# Patient Record
Sex: Female | Born: 1985 | Race: Black or African American | Hispanic: No | Marital: Married | Smoking: Current every day smoker
Health system: Southern US, Community
[De-identification: ages and names within clinical notes are randomized; demographics above are authoritative.]

## PROBLEM LIST (undated history)

## (undated) DIAGNOSIS — F419 Anxiety disorder, unspecified: Secondary | ICD-10-CM

## (undated) DIAGNOSIS — D649 Anemia, unspecified: Secondary | ICD-10-CM

## (undated) DIAGNOSIS — A150 Tuberculosis of lung: Secondary | ICD-10-CM

## (undated) DIAGNOSIS — N390 Urinary tract infection, site not specified: Secondary | ICD-10-CM

## (undated) DIAGNOSIS — D249 Benign neoplasm of unspecified breast: Secondary | ICD-10-CM

## (undated) HISTORY — PX: BREAST LUMPECTOMY: SHX2

---

## 2008-11-10 HISTORY — PX: BREAST LUMPECTOMY: SHX2

## 2012-11-10 HISTORY — PX: BREAST LUMPECTOMY: SHX2

## 2014-03-05 ENCOUNTER — Emergency Department (HOSPITAL_COMMUNITY)
Admission: EM | Admit: 2014-03-05 | Discharge: 2014-03-05 | Disposition: A | Discharge status: Home / Self Care | Payer: Medicaid - Out of State | Attending: Emergency Medicine | Admitting: Emergency Medicine

## 2014-03-05 ENCOUNTER — Encounter (HOSPITAL_COMMUNITY): Payer: Self-pay | Admitting: Emergency Medicine

## 2014-03-05 DIAGNOSIS — Z87440 Personal history of urinary (tract) infections: Secondary | ICD-10-CM | POA: Insufficient documentation

## 2014-03-05 DIAGNOSIS — K219 Gastro-esophageal reflux disease without esophagitis: Secondary | ICD-10-CM | POA: Insufficient documentation

## 2014-03-05 DIAGNOSIS — Z3202 Encounter for pregnancy test, result negative: Secondary | ICD-10-CM | POA: Insufficient documentation

## 2014-03-05 DIAGNOSIS — N12 Tubulo-interstitial nephritis, not specified as acute or chronic: Secondary | ICD-10-CM

## 2014-03-05 LAB — URINE MICROSCOPIC-ADD ON

## 2014-03-05 LAB — BASIC METABOLIC PANEL
BUN: 16 mg/dL (ref 6–23)
CALCIUM: 9 mg/dL (ref 8.4–10.5)
CHLORIDE: 105 meq/L (ref 96–112)
CO2: 25 meq/L (ref 19–32)
CREATININE: 0.79 mg/dL (ref 0.50–1.10)
GFR calc non Af Amer: >90 mL/min (ref >90)
Glucose, Bld: 87 mg/dL (ref 70–99)
Potassium: 3.9 mEq/L (ref 3.7–5.3)
Sodium: 141 mEq/L (ref 137–147)

## 2014-03-05 LAB — URINALYSIS, ROUTINE W REFLEX MICROSCOPIC
Bilirubin Urine: NEGATIVE
Glucose, UA: NEGATIVE mg/dL
Hgb urine dipstick: NEGATIVE
Ketones, ur: NEGATIVE mg/dL
NITRITE: POSITIVE — AB
PH: 6.5 (ref 5.0–8.0)
Protein, ur: NEGATIVE mg/dL
Specific Gravity, Urine: 1.023 (ref 1.005–1.030)
Urobilinogen, UA: 1 mg/dL (ref 0.0–1.0)

## 2014-03-05 LAB — CBC
HEMATOCRIT: 33.5 % — AB (ref 36.0–46.0)
Hemoglobin: 11 g/dL — ABNORMAL LOW (ref 12.0–15.0)
MCH: 30 pg (ref 26.0–34.0)
MCHC: 32.8 g/dL (ref 30.0–36.0)
MCV: 91.3 fL (ref 78.0–100.0)
Platelets: 189 10*3/uL (ref 150–400)
RBC: 3.67 MIL/uL — ABNORMAL LOW (ref 3.87–5.11)
RDW: 12.9 % (ref 11.5–15.5)
WBC: 8.4 10*3/uL (ref 4.0–10.5)

## 2014-03-05 LAB — I-STAT TROPONIN, ED: Troponin i, poc: 0 ng/mL (ref 0.00–0.08)

## 2014-03-05 LAB — POC URINE PREG, ED: Preg Test, Ur: NEGATIVE

## 2014-03-05 MED ORDER — SODIUM CHLORIDE 0.9 % IV BOLUS (SEPSIS): 500.0000 mL | Freq: Once | INTRAVENOUS | Status: DC

## 2014-03-05 MED ORDER — ONDANSETRON HCL 4 MG/2ML IJ SOLN
4.0000 mg | Freq: Once | INTRAMUSCULAR | Status: AC
  Administered 2014-03-05: 4 mg via INTRAVENOUS
  Filled 2014-03-05: qty 2

## 2014-03-05 MED ORDER — KETOROLAC TROMETHAMINE 30 MG/ML IJ SOLN
30.0000 mg | Freq: Once | INTRAMUSCULAR | Status: AC
  Administered 2014-03-05: 30 mg via INTRAVENOUS
  Filled 2014-03-05: qty 1

## 2014-03-05 MED ORDER — DEXTROSE 5 % IV SOLN
1.0000 g | Freq: Once | INTRAVENOUS | Status: AC
  Administered 2014-03-05: 1 g via INTRAVENOUS
  Filled 2014-03-05: qty 10

## 2014-03-05 MED ORDER — SODIUM CHLORIDE 0.9 % IV BOLUS (SEPSIS)
1000.0000 mL | Freq: Once | INTRAVENOUS | Status: AC
  Administered 2014-03-05: 1000 mL via INTRAVENOUS

## 2014-03-05 MED ORDER — GI COCKTAIL ~~LOC~~
30.0000 mL | Freq: Once | ORAL | Status: AC
  Administered 2014-03-05: 30 mL via ORAL
  Filled 2014-03-05: qty 30

## 2014-03-05 MED ORDER — CIPROFLOXACIN HCL 500 MG PO TABS: 500.0000 mg | ORAL_TABLET | Freq: Two times a day (BID) | ORAL | Status: DC

## 2014-03-05 NOTE — ED Notes (Signed)
Md Vanita Panda at bedside.

## 2014-03-05 NOTE — Discharge Instructions (Signed)
Please call your doctor for a followup appointment within 24-48 hours. When you talk to your doctor please let them know that you were seen in the emergency department and have them acquire all of your records so that they can discuss the findings with you and formulate a treatment plan to fully care for your new and ongoing problems. Please call and set-up an appointment with your primary care provider when you get home to [place] Please rest and stay hydrated Please avoid foods high in fat, grease Please continue to take omeprazole as prescribed for GERD Please drink plenty of water - can drink cranberry juice Please take antibiotics and finish course Can use Ibuprofen as needed for discomfort  Please continue to monitor symptoms closely if symptoms are to worsen or change (fever greater than 101, chills, diaphoresis/sweating, chest pain, shortness of breath, difficulty breathing, abdominal pain, nausea, vomiting, diarrhea, inability to keep food and fluids down, dizziness, weakness, blurred vision, sudden loss of vision, back pain) please report back to the ED immediately  Pyelonephritis, Adult Pyelonephritis is a kidney infection. In general, there are 2 main types of pyelonephritis:  Infections that come on quickly without any warning (acute pyelonephritis).  Infections that persist for a long period of time (chronic pyelonephritis). CAUSES  Two main causes of pyelonephritis are:  Bacteria traveling from the bladder to the kidney. This is a problem especially in pregnant women. The urine in the bladder can become filled with bacteria from multiple causes, including:  Inflammation of the prostate gland (prostatitis).  Sexual intercourse in females.  Bladder infection (cystitis).  Bacteria traveling from the bloodstream to the tissue part of the kidney. Problems that may increase your risk of getting a kidney infection include:  Diabetes.  Kidney stones or bladder  stones.  Cancer.  Catheters placed in the bladder.  Other abnormalities of the kidney or ureter. SYMPTOMS   Abdominal pain.  Pain in the side or flank area.  Fever.  Chills.  Upset stomach.  Blood in the urine (dark urine).  Frequent urination.  Strong or persistent urge to urinate.  Burning or stinging when urinating. DIAGNOSIS  Your caregiver may diagnose your kidney infection based on your symptoms. A urine sample may also be taken. TREATMENT  In general, treatment depends on how severe the infection is.   If the infection is mild and caught early, your caregiver may treat you with oral antibiotics and send you home.  If the infection is more severe, the bacteria may have gotten into the bloodstream. This will require intravenous (IV) antibiotics and a hospital stay. Symptoms may include:  High fever.  Severe flank pain.  Shaking chills.  Even after a hospital stay, your caregiver may require you to be on oral antibiotics for a period of time.  Other treatments may be required depending upon the cause of the infection. HOME CARE INSTRUCTIONS   Take your antibiotics as directed. Finish them even if you start to feel better.  Make an appointment to have your urine checked to make sure the infection is gone.  Drink enough fluids to keep your urine clear or pale yellow.  Take medicines for the bladder if you have urgency and frequency of urination as directed by your caregiver. SEEK IMMEDIATE MEDICAL CARE IF:   You have a fever or persistent symptoms for more than 2-3 days.  You have a fever and your symptoms suddenly get worse.  You are unable to take your antibiotics or fluids.  You  develop shaking chills.  You experience extreme weakness or fainting.  There is no improvement after 2 days of treatment. MAKE SURE YOU:  Understand these instructions.  Will watch your condition.  Will get help right away if you are not doing well or get  worse. Document Released: 10/27/2005 Document Revised: 04/27/2012 Document Reviewed: 04/02/2011 [facility] Patient Information 2014 [place], [place].   Diet for Gastroesophageal Reflux Disease, Adult Reflux (acid reflux) is when acid from your stomach flows up into the esophagus. When acid comes in contact with the esophagus, the acid causes irritation and soreness (inflammation) in the esophagus. When reflux happens often or so severely that it causes damage to the esophagus, it is called gastroesophageal reflux disease (GERD). Nutrition therapy can help ease the discomfort of GERD. FOODS OR DRINKS TO AVOID OR LIMIT  Smoking or chewing tobacco. Nicotine is one of the most potent stimulants to acid production in the gastrointestinal tract.  Caffeinated and decaffeinated coffee and black tea.  Regular or low-calorie carbonated beverages or energy drinks (caffeine-free carbonated beverages are allowed).   Strong spices, such as black pepper, white pepper, red pepper, cayenne, curry powder, and chili powder.  Peppermint or spearmint.  Chocolate.  High-fat foods, including meats and fried foods. Extra added fats including oils, butter, salad dressings, and nuts. Limit these to less than 8 tsp per day.  Fruits and vegetables if they are not tolerated, such as citrus fruits or tomatoes.  Alcohol.  Any food that seems to aggravate your condition. If you have questions regarding your diet, call your caregiver or a registered dietitian. OTHER THINGS THAT MAY HELP GERD INCLUDE:   Eating your meals slowly, in a relaxed setting.  Eating 5 to 6 small meals per day instead of 3 large meals.  Eliminating food for a period of time if it causes distress.  Not lying down until 3 hours after eating a meal.  Keeping the head of your bed raised 6 to 9 inches (15 to 23 cm) by using a foam wedge or blocks under the legs of the bed. Lying flat may make symptoms worse.  Being physically active. Weight  loss may be helpful in reducing reflux in overweight or obese adults.  Wear loose fitting clothing EXAMPLE MEAL PLAN This meal plan is approximately 2,000 calories based on [email or web address] meal planning guidelines. Breakfast   cup cooked oatmeal.  1 cup strawberries.  1 cup low-fat milk.  1 oz almonds. Snack  1 cup cucumber slices.  6 oz yogurt (made from low-fat or fat-free milk). Lunch  2 slice whole-wheat bread.  2 oz sliced [place].  2 tsp mayonnaise.  1 cup blueberries.  1 cup snap peas. Snack  6 whole-wheat crackers.  1 oz string cheese. Dinner   cup brown rice.  1 cup mixed veggies.  1 tsp olive oil.  3 oz grilled fish. Document Released: 10/27/2005 Document Revised: 01/19/2012 Document Reviewed: 09/12/2011 [facility] Patient Information 2014 [place], [place].   Emergency Department Resource Guide 1) Find a Doctor and Pay Out of Pocket Although you won't have to find out who is covered by your insurance plan, it is a good idea to ask around and get recommendations. You will then need to call the office and see if the doctor you have chosen will accept you as a new patient and what types of options they offer for patients who are self-pay. Some doctors offer discounts or will set up payment plans for their patients who do not have insurance, but  you will need to ask so you aren't surprised when you get to your appointment.  2) Contact Your Local Health Department Not all health departments have doctors that can see patients for sick visits, but many do, so it is worth a call to see if yours does. If you don't know where your local health department is, you can check in your phone book. The CDC also has a tool to help you locate your state's health department, and many state websites also have listings of all of their local health departments.  3) Find a [facility] If your illness is not likely to be very severe or complicated, you may want to try a  walk in clinic. These are popping up all over the country in pharmacies, drugstores, and shopping centers. They're usually staffed by nurse practitioners or physician assistants that have been trained to treat common illnesses and complaints. They're usually fairly quick and inexpensive. However, if you have serious medical issues or chronic medical problems, these are probably not your best option.  No Primary Care Doctor: - Call Health Connect at  [phone] - they can help you locate a primary care doctor that  accepts your insurance, provides certain services, etc. - Physician Referral Service- [phone]  Chronic Pain Problems: Organization         Address  Phone   Notes  [facility]  [phone] Patients need to be referred by their primary care doctor.   Medication Assistance: Organization         Address  Phone   Notes  [facility] Medication [facility] [address] [place], [place] [address] [phone] --Must be a resident of [facility] -- Must have NO insurance coverage whatsoever (no Medicaid/ Medicare, etc.) -- The pt. MUST have a primary care doctor that directs their care regularly and follows them in the community   MedAssist  [phone]   [employer]  [phone]    Agencies that provide inexpensive medical care: Organization         Address  Phone   Notes  [place]  [phone]   [facility]    [phone]   [facility] [place], [place] [address] [phone]   [place] [address] [place] [phone]   Planned Parenthood    [phone]   [facility]    [phone]   [place] and [place], [place] Phone:  [phone], Fax:  [phone] Hours of Operation:  9 am - 6 pm, M-F.  Also accepts Medicaid/Medicare and self-pay.  [facility] for [place] [address], [address], [place] Phone: [phone], Fax: [phone]. Hours of Operation:  8:30 am - 5:30 pm, M-F.  Also accepts Medicaid and self-pay.  [facility] [place] [address] [place] Phone: [phone]   [place], [place], [place] [phone], Ext. [phone] Mondays & Thursdays: 7-9 AM.  First 15 patients are seen on a first come, first serve basis.    [place] Providers:  Organization         Address  Phone   Notes  [facility] [address], [place] [phone] Also accepts self-pay patients.  [facility] [address], [place]  [place]  [phone]   [place], [address], [place] [phone]   [place] [address], [place] [phone]   Lucianne Lei [address], [place]   [phone] Only accepts [place] Access [place] patients after they have their name applied to their card.   Self-Pay (no insurance) in [facility]:  Organization         Address  Phone   Notes  Sickle Cell Patients, [facility] Internal [facility] [place] [phone]   [facility] [place] [phone]   [facility] [place]  [place], [place], [place] [phone]   [facility]/Dr. Osei-Bonsu  [address], [place] or [address], [place] [phone] Phone number for both [place] and [place] locations is the same.  [facility] [address] [place] [phone]   [facility] [address] [place] or [address] [phone]   [facility] [address] [place] [phone], phone; [phone], fax Sees  patients 1st and 3rd Saturday of every month.  Must not qualify for public or private insurance (i.e. Medicaid, Medicare, [place] Health Choice, Veterans' Benefits)  Household income should be no more than 200% of the poverty level The clinic cannot treat you if you are pregnant or think you are pregnant  Sexually transmitted diseases are not treated at the clinic.    Dental Care: Organization         Address  Phone  Notes  [facility] Department of [facility] [place] [phone] Accepts children up to age 46 who are enrolled in [place] or [place]; pregnant women with a Medicaid card; and children who have applied for Medicaid or [place] Health Choice, but were declined, whose parents can pay a reduced fee at time of service.  [facility] Department of [facility]  [address], [place] [phone] Accepts children up to age 29 who are enrolled in [place] or [place]; pregnant women with a Medicaid card; and children who have applied for Medicaid or [place] Health Choice, but were declined, whose parents can pay a reduced fee at time of service.  [place] Adult Dental Access PROGRAM  [place] [phone] Patients are seen by appointment only. Walk-ins are not accepted. [place] will see patients 45 years of age and older. Monday - Tuesday (8am-5pm) Most Wednesdays (8:30-5pm) $30 per visit, cash only  [facility] Adult Dental Access PROGRAM  [address], [facility] [phone] Patients are seen by appointment only. Walk-ins are not accepted. [place] will see patients 79 years of age and older. One Wednesday Evening (Monthly: Volunteer Based).  $30 per visit, cash only  [place]  [phone] for adults; Children under age 8, call Graduate Pediatric Dentistry at [phone]. Children aged 85-14, please call [phone] to request a  pediatric application.  Dental services are provided in all areas of dental care including fillings, crowns and bridges, complete and partial dentures, implants, gum treatment, root canals, and extractions. Preventive care is also provided. Treatment is provided to both adults and children. Patients are selected via a lottery  and there is often a waiting list.   [facility] [address], [place]  [phone] [email or web address]   [facility] [address] [place], [place] [phone], Ext. [phone] Second and Fourth Thursday of each month, opens at 6:30 AM; Clinic ends at 9 AM.  Patients are seen on a first-come first-served basis, and a limited number are seen during each clinic.   [facility]  [address] Hillard Danker [place], [place] [phone]   Eligibility Requirements You must have lived in [place], [place], or [place] counties for at least the last three months.   You cannot be eligible for state or federal sponsored [employer], including [employer], [place], or [employer].   You generally cannot be eligible for healthcare insurance through your employer.    How to apply: Eligibility screenings are held every Tuesday and Wednesday afternoon from 1:00 pm until 4:00 pm. You do not need an appointment for the interview!  [facility] [address], [place], [place]   [place]  [place] Department  [place]  [phone]    Behavioral Health Resources in the Community: Intensive Outpatient Programs Organization         Address  Phone  Notes  [place]. [address], [place], [place] [phone]   [facility] Outpatient [address] [place], [place]   ADS: [facility] [address], [place], [place]   [place] [address],  [place], [place] or [phone]   Substance Abuse Resources Organization         Address  Phone  Notes  Alcohol and Drug Services  [phone]   [place]  [phone]   [facility] [place]   Chinita Pester  [phone]   Residential & Outpatient Substance Abuse Program  [phone]   Psychological Services Organization         Address  Phone  Notes  [facility] [place]  [place]  [phone]   [place] [address], [place] or [phone]    Mobile Crisis Teams Organization         Address  Phone  Notes  [facility], Mobile Crisis Care Unit  [phone]   [facility]  [address] [place], [place]   [place] [address], [place] [phone]    Self-Help/Support Groups Organization         Address  Phone             Notes  [place]. of [place] - variety of support groups  [place] Call for more information  Narcotics Anonymous (NA), [facility] [address], [employer] [place]  2 meetings at this location   Special educational needs teacher         Address  Phone  Notes  [facility]AP Residential Treatment [place],    [place]  [phone]   [facility]  [address] [place] [phone], [place], [place]   [place], [place] [phone] Admissions: 8am-3pm M-F  Incentives Substance [place] [address]    [place], [place] [phone]   [facility] [place] [address], [place], [place]  [place].,  [place], [place]   [facility] - Intensive Outpatient [address], [place], [place]   [facility] [place].,    [place], [place] [phone] or [phone]   [facility] Services (RTS) [address], [place], [place] Accepts Medicaid  Fellowship [place] [address],  [place] [phone] Substance Abuse/Addiction Treatment   [facility] Organization         Address  Phone  Notes  [facility]  [phone]   Domenic Schwab, PhD [address] Arlis Port[place], [place]   [phone] or [phone]   [place], [place] [phone]   [address], [place], [place] [phone] Insurance/Medicaid/sponsorship through [facility] and Families [address] [place]                                    [place], [place] [phone] [place] [address] [place], [place] [phone]    Dr. Adele Schilder  [phone]   [facility] Dept. 1) [address] [place] 2) [facility] [place] 3)  [place] [address], [place] [phone]  [phone]   [place] [phone] or [phone] (After Hours)

## 2014-03-05 NOTE — ED Notes (Signed)
Pt reports right flank pain x 3 days, progressively worsening. States movement and palpation makes worse. Denies N/V/D. Pt reports recently had UTI but didn't finish course of abx since symptoms resolved. Pt reports urinary frequency, but denies dysuria. NAD.

## 2014-03-05 NOTE — ED Provider Notes (Signed)
CSN: [id number]     Arrival date & time 03/05/14  1203 History   First MD Initiated Contact with Patient 03/05/14 1214     Chief Complaint  Patient presents with  . Flank Pain  . Urinary Frequency     (Consider location/radiation/quality/duration/timing/severity/associated sxs/prior Treatment) The history is provided by the patient. No language interpreter was used.  Laurie Friedman is a 28 y/o female with no known significant past medical history, suspicion for GERD, presenting to the ED with right-sided flank pain has been ongoing for the past 3 days. Reported that the right flank as an aching sensation without radiation. Stated the pain worsens with deep inhalation. Stated that she was diagnosed with a urinary tract infection on 02/10/2014 was started on Macrobid-was due to take 1 tablet 2 times a day for 10 days, patient reported that she took the medication for 5 days and stopped. Reported that she's been having mild nausea without episodes of emesis. Stated that she's been having intermittent chest burning sensation, reported that she was seen and assessed her murmur sounds in ED setting with normal EKGs and normal findings-reported that she was diagnosed with GERD for the past month. Has been taking omeprazole with minimal relief. Stated that she smokes cigarettes, stated that she has stopped approximately one week ago. Denied shortness of breath, difficulty breathing, fever, chills, diaphoresis, diarrhea, melena, hematochezia, hematuria, cough, nasal congestion. Denied birth control. Patient originally from [place] - stated that her and her family drove here, reported taking breaks inbetween.  PCP none  History reviewed. No pertinent past medical history. History reviewed. No pertinent past surgical history. No family history on file. History  Substance Use Topics  . Smoking status: Never Smoker   . Smokeless tobacco: Not on file  . Alcohol Use: No   OB History   Grav Para Term Preterm  Abortions TAB SAB Ect Mult Living                 Review of Systems  Constitutional: Negative for fever and chills.  HENT: Negative for sore throat and trouble swallowing.   Eyes: Negative for visual disturbance.  Respiratory: Positive for chest tightness. Negative for cough and shortness of breath.   Cardiovascular: Negative for chest pain.  Gastrointestinal: Positive for nausea. Negative for vomiting, abdominal pain, diarrhea, constipation, blood in stool and anal bleeding.  Genitourinary: Negative for dysuria, flank pain, decreased urine volume, vaginal bleeding, vaginal discharge and pelvic pain.  Musculoskeletal: Negative for back pain and neck pain.  Neurological: Negative for dizziness, weakness and light-headedness.  All other systems reviewed and are negative.     Allergies  Review of patient's allergies indicates no known allergies.  Home Medications   Prior to Admission medications   Not on File   BP 109/62  Pulse 83  Temp(Src) 97.5 F (36.4 C) (Oral)  Resp 15  Ht 5\' 6"  (1.676 m)  Wt 115 lb (52.164 kg)  BMI 18.57 kg/m2  SpO2 100% Physical Exam  Nursing note and vitals reviewed. Constitutional: She is oriented to person, place, and time. She appears well-developed and well-nourished. No distress.  HENT:  Head: Normocephalic and atraumatic.  Mouth/Throat: Oropharynx is clear and moist. No oropharyngeal exudate.  Eyes: Conjunctivae and EOM are normal. Pupils are equal, round, and reactive to light. Right eye exhibits no discharge. Left eye exhibits no discharge.  Neck: Normal range of motion. Neck supple. No tracheal deviation present.  Cardiovascular: Normal rate, regular rhythm and normal heart sounds.  Exam reveals no friction rub.   No murmur heard. Pulses:      Radial pulses are 2+ on the right side, and 2+ on the left side.       Dorsalis pedis pulses are 2+ on the right side, and 2+ on the left side.  Cap refill less than 3 seconds Negative swelling or  pitting edema identified to the lower extremities Negative calf tenderness upon palpation bilaterally  Pulmonary/Chest: Effort normal and breath sounds normal. No respiratory distress. She has no wheezes. She has no rales. She exhibits no tenderness.  Patient is able to speak in full sentences without difficulty Negative use of accessory muscles Negative stridor  Negative air hunger  Abdominal: Soft. Bowel sounds are normal. She exhibits no distension. There is no tenderness. There is CVA tenderness (right). There is no rebound and no guarding.  Negative abdominal distention noted Soft upon palpation Negative peritoneal signs Negative rigidity upon palpation Positive right-sided CVA tenderness noted  Musculoskeletal: Normal range of motion.  Lymphadenopathy:    She has no cervical adenopathy.  Neurological: She is alert and oriented to person, place, and time. No cranial nerve deficit. She exhibits normal muscle tone. Coordination normal.  Skin: Skin is warm and dry. No rash noted. She is not diaphoretic. No erythema.  Psychiatric: She has a normal mood and affect. Her behavior is normal. Thought content normal.    ED Course  Procedures (including critical care time)  Results for orders placed during the hospital encounter of 03/05/14  URINALYSIS, ROUTINE W REFLEX MICROSCOPIC      Result Value Ref Range   Color, Urine YELLOW  YELLOW   APPearance CLOUDY (*) CLEAR   Specific Gravity, Urine 1.023  1.005 - 1.030   pH 6.5  5.0 - 8.0   Glucose, UA NEGATIVE  NEGATIVE mg/dL   Hgb urine dipstick NEGATIVE  NEGATIVE   Bilirubin Urine NEGATIVE  NEGATIVE   Ketones, ur NEGATIVE  NEGATIVE mg/dL   Protein, ur NEGATIVE  NEGATIVE mg/dL   Urobilinogen, UA 1.0  0.0 - 1.0 mg/dL   Nitrite POSITIVE (*) NEGATIVE   Leukocytes, UA MODERATE (*) NEGATIVE  URINE MICROSCOPIC-ADD ON      Result Value Ref Range   Squamous Epithelial / LPF MANY (*) RARE   WBC, UA 21-50  <3 WBC/hpf   RBC / HPF 0-2  <3  RBC/hpf   Bacteria, UA MANY (*) RARE  CBC      Result Value Ref Range   WBC 8.4  4.0 - 10.5 K/uL   RBC 3.67 (*) 3.87 - 5.11 MIL/uL   Hemoglobin 11.0 (*) 12.0 - 15.0 g/dL   HCT 33.5 (*) 36.0 - 46.0 %   MCV 91.3  78.0 - 100.0 fL   MCH 30.0  26.0 - 34.0 pg   MCHC 32.8  30.0 - 36.0 g/dL   RDW 12.9  11.5 - 15.5 %   Platelets 189  150 - 400 K/uL  BASIC METABOLIC PANEL      Result Value Ref Range   Sodium 141  137 - 147 mEq/L   Potassium 3.9  3.7 - 5.3 mEq/L   Chloride 105  96 - 112 mEq/L   CO2 25  19 - 32 mEq/L   Glucose, Bld 87  70 - 99 mg/dL   BUN 16  6 - 23 mg/dL   Creatinine, Ser 0.79  0.50 - 1.10 mg/dL   Calcium 9.0  8.4 - 10.5 mg/dL   GFR calc non Af  Amer >90  >90 mL/min   GFR calc Af Amer >90  >90 mL/min  POC URINE PREG, ED      Result Value Ref Range   Preg Test, Ur NEGATIVE  NEGATIVE  I-STAT TROPOININ, ED      Result Value Ref Range   Troponin i, poc 0.00  0.00 - 0.08 ng/mL   Comment 3             Labs Review Labs Reviewed  URINALYSIS, ROUTINE W REFLEX MICROSCOPIC - Abnormal; Notable for the following:    APPearance CLOUDY (*)    Nitrite POSITIVE (*)    Leukocytes, UA MODERATE (*)    All other components within normal limits  URINE MICROSCOPIC-ADD ON - Abnormal; Notable for the following:    Squamous Epithelial / LPF MANY (*)    Bacteria, UA MANY (*)    All other components within normal limits  CBC - Abnormal; Notable for the following:    RBC 3.67 (*)    Hemoglobin 11.0 (*)    HCT 33.5 (*)    All other components within normal limits  URINE CULTURE  BASIC METABOLIC PANEL  POC URINE PREG, ED  I-STAT TROPOININ, ED    Imaging Review No results found.   EKG Interpretation   Date/Time:  Sunday March 05 2014 13:31:38 EDT Ventricular Rate:  66 PR Interval:  157 QRS Duration: 91 QT Interval:  376 QTC Calculation: 394 R Axis:   76 Text Interpretation:  Sinus rhythm RSR' in V1 or V2, right VCD or RVH  Sinus rhythm RSR' or QR pattern in V1 suggests  right ventricular  conduction delay Borderline ECG Confirmed by Carmin Muskrat  MD [phone])  on 03/05/2014 3:55:04 PM      MDM   Final diagnoses:  Pyelonephritis  GERD (gastroesophageal reflux disease)   Medications  ondansetron (ZOFRAN) injection 4 mg (4 mg Intravenous Given 03/05/14 1413)  cefTRIAXone (ROCEPHIN) 1 g in dextrose 5 % 50 mL IVPB (0 g Intravenous Stopped 03/05/14 1443)  sodium chloride 0.9 % bolus 1,000 mL (0 mLs Intravenous Stopped 03/05/14 1517)  ketorolac (TORADOL) 30 MG/ML injection 30 mg (30 mg Intravenous Given 03/05/14 1427)  gi cocktail (Maalox,Lidocaine,Donnatal) (30 mLs Oral Given 03/05/14 1602)   Filed Vitals:   03/05/14 1330 03/05/14 1415 03/05/14 1445 03/05/14 1515  BP: 100/61 117/59 101/55 109/62  Pulse: 71 77 51 83  Temp:      TempSrc:      Resp:  22 18 15   Height:      Weight:      SpO2: 100% 100% 100% 100%    EKG normal sinus rhythm with a heart rate of 66 beats per minute with mild RVH-negative ischemic findings noted.. I-STAT troponin negative elevation. CBC negative elevated white blood cell count identified. BMP negative findings-kidneys functioning well. Urine pregnancy negative. Urinalysis noted positive nitrites, moderate leukocytes with pyuria of 21-50 many bacteria noted. Urine culture pending. Patient given IV fluids as well as a round of antibiotics in ED setting. Pain controlled in ED setting. Doubt acute abdominal processes. Abdomen soft, negative peritoneal signs-nonsurgical abdomen noted. Doubt nephrolithiasis. Doubt ectopic pregnancy. Doubt TOA. Patient presenting to the ED with pyelonephritis secondary to noncompliance with antibiotic medication with initial UTI. Negative elevated white blood cell count, patient afebrile, negative drop in pulse ox-negative hypoxia noted, negative tachypnea tachycardia-patient does not appear septic. Doubt cardiac issue-patient is 28 years of age with limited risk factors. This chest pain has been ongoing for  a  couple of months as per patient and she reported that she has had numerous EKGs with negative findings. Patient has history of smoking. Denied BCP. Doubt PE - PERC score 0. Suspicion of chest discomfort to be GERD. Patient stable, afebrile. Patient stable for discharge. Discharged patient with antibiotics. Discussed with patient to rest and stay hydrated. Discussed with patient to followup with primary care provider when she gets home to [place]. Discussed with patient importance of medical compliance and finishing antibiotics - discussed dangers, consequences if not finished, discussed possible sepsis if antibiotics not properly taken. Discussed with patient to closely monitor symptoms and if symptoms are to worsen or change to report back to the ED - strict return instructions given.  Patient agreed to plan of care, understood, all questions answered.   Jamse Mead, PA-C 03/06/14 1159

## 2014-03-05 NOTE — ED Notes (Signed)
Pt presents to department for evaluation of R sided flank pain. Also states urinary frequency. 8/10 pain at the time. States she was recently diagnosed with UTI, but did not complete antibiotic regimen. States continued pain. Pt is alert and oriented x4. NAD.

## 2014-03-07 LAB — URINE CULTURE: Colony Count: >=100000

## 2014-03-08 NOTE — ED Provider Notes (Signed)
  Medical screening examination/treatment/procedure(s) were performed by non-physician practitioner and as supervising physician I was immediately available for consultation/collaboration.   EKG Interpretation   Date/Time:  Sunday March 05 2014 13:31:38 EDT Ventricular Rate:  66 PR Interval:  157 QRS Duration: 91 QT Interval:  376 QTC Calculation: 394 R Axis:   76 Text Interpretation:  Sinus rhythm RSR' in V1 or V2, right VCD or RVH  Sinus rhythm RSR' or QR pattern in V1 suggests right ventricular  conduction delay Borderline ECG Confirmed by Carmin Muskrat  MD [phone])  on 03/05/2014 3:55:04 PM            Carmin Muskrat, MD 03/08/14 1525

## 2014-03-10 ENCOUNTER — Telehealth (HOSPITAL_BASED_OUTPATIENT_CLINIC_OR_DEPARTMENT_OTHER): Payer: Self-pay | Admitting: Emergency Medicine

## 2014-03-10 NOTE — Telephone Encounter (Signed)
Post ED Visit - Positive Culture Follow-up  Culture report reviewed by antimicrobial stewardship pharmacist: []  Wes Dulaney, Pharm.D., BCPS []  Heide Guile, Pharm.D., BCPS [x]  Alycia Rossetti, Pharm.D., BCPS []  Coats, [place].D., BCPS, AAHIVP []  Legrand Como, Pharm.D., BCPS, AAHIVP []  Juliene Pina, Pharm.D.  Positive urine culture Treated with Cipro, organism sensitive to the same and no further patient follow-up is required at this time.  Vara Mairena 03/10/2014, 2:06 PM

## 2020-05-26 ENCOUNTER — Emergency Department (HOSPITAL_COMMUNITY)
Admission: EM | Admit: 2020-05-26 | Discharge: 2020-05-26 | Disposition: A | Discharge status: Home / Self Care | Payer: HRSA Program | Attending: Emergency Medicine | Admitting: Emergency Medicine

## 2020-05-26 ENCOUNTER — Other Ambulatory Visit: Payer: Self-pay

## 2020-05-26 ENCOUNTER — Emergency Department (HOSPITAL_COMMUNITY): Payer: HRSA Program

## 2020-05-26 ENCOUNTER — Encounter (HOSPITAL_COMMUNITY): Payer: Self-pay

## 2020-05-26 DIAGNOSIS — J189 Pneumonia, unspecified organism: Secondary | ICD-10-CM | POA: Insufficient documentation

## 2020-05-26 DIAGNOSIS — Z20822 Contact with and (suspected) exposure to covid-19: Secondary | ICD-10-CM | POA: Insufficient documentation

## 2020-05-26 DIAGNOSIS — R0602 Shortness of breath: Secondary | ICD-10-CM | POA: Diagnosis present

## 2020-05-26 LAB — BASIC METABOLIC PANEL
Anion gap: 9 (ref 5–15)
BUN: 10 mg/dL (ref 6–20)
CO2: 22 mmol/L (ref 22–32)
Calcium: 9.4 mg/dL (ref 8.9–10.3)
Chloride: 105 mmol/L (ref 98–111)
Creatinine, Ser: 0.8 mg/dL (ref 0.44–1.00)
GFR calc Af Amer: >60 mL/min (ref >60)
GFR calc non Af Amer: >60 mL/min (ref >60)
Glucose, Bld: 111 mg/dL — ABNORMAL HIGH (ref 70–99)
Potassium: 3.4 mmol/L — ABNORMAL LOW (ref 3.5–5.1)
Sodium: 136 mmol/L (ref 135–145)

## 2020-05-26 LAB — CBC
HCT: 38.8 % (ref 36.0–46.0)
Hemoglobin: 12.1 g/dL (ref 12.0–15.0)
MCH: 27.9 pg (ref 26.0–34.0)
MCHC: 31.2 g/dL (ref 30.0–36.0)
MCV: 89.6 fL (ref 80.0–100.0)
Platelets: 234 10*3/uL (ref 150–400)
RBC: 4.33 MIL/uL (ref 3.87–5.11)
RDW: 13.8 % (ref 11.5–15.5)
WBC: 6.4 10*3/uL (ref 4.0–10.5)
nRBC: 0 % (ref 0.0–0.2)

## 2020-05-26 LAB — SARS CORONAVIRUS 2 BY RT PCR (HOSPITAL ORDER, PERFORMED IN ~~LOC~~ HOSPITAL LAB): SARS Coronavirus 2: NEGATIVE

## 2020-05-26 LAB — TROPONIN I (HIGH SENSITIVITY): Troponin I (High Sensitivity): <2 ng/L (ref <18)

## 2020-05-26 LAB — I-STAT BETA HCG BLOOD, ED (MC, WL, AP ONLY): I-stat hCG, quantitative: <5 m[IU]/mL (ref <5)

## 2020-05-26 LAB — D-DIMER, QUANTITATIVE: D-Dimer, Quant: 0.7 ug/mL-FEU — ABNORMAL HIGH (ref 0.00–0.50)

## 2020-05-26 MED ORDER — AMOXICILLIN 500 MG PO CAPS
1000.0000 mg | ORAL_CAPSULE | Freq: Three times a day (TID) | ORAL | 0 refills | Status: AC
Start: 2020-05-26 — End: 2020-05-31

## 2020-05-26 MED ORDER — IOHEXOL 350 MG/ML SOLN
75.0000 mL | Freq: Once | INTRAVENOUS | Status: AC | PRN
  Administered 2020-05-26: 75 mL via INTRAVENOUS

## 2020-05-26 MED ORDER — AZITHROMYCIN 250 MG PO TABS
250.0000 mg | ORAL_TABLET | Freq: Every day | ORAL | 0 refills | Status: DC
Start: 2020-05-26 — End: 2022-04-22

## 2020-05-26 MED ORDER — SODIUM CHLORIDE 0.9% FLUSH: 3.0000 mL | Freq: Once | INTRAVENOUS | Status: DC

## 2020-05-26 NOTE — Discharge Instructions (Signed)
Your CT scan and lab work were largely reassuring but it did show a developing pneumonia in the right lower lobe of your lung.  Please take both antibiotics as directed, even if you are feeling better it is important that you take entire course of antibiotics.  If you develop worsening shortness of breath, chest pain, fevers or any other new or concerning symptoms please return to the ED.  I have also sent off a Covid test to ensure this is not the cause of your pneumonia, you will be called by phone if these results are positive.  Please consider getting your Covid vaccine

## 2020-05-26 NOTE — ED Triage Notes (Signed)
Pt arrives to ED w/ c/o sob x 2 days. Pt denies cp, cough, fever. Pt has hx of anxiety but states this feels different. Resp e/u.

## 2020-05-26 NOTE — ED Notes (Signed)
Verbalized understanding of DC instructions, Rx, follow up care.  Informed patient to self quarantine until COVID test has resulted

## 2020-05-26 NOTE — ED Provider Notes (Signed)
[facility] EMERGENCY DEPARTMENT Provider Note   CSN: [id number] Arrival date & time: 05/26/20  1357     History Chief Complaint  Patient presents with  . Shortness of Breath    Laurie Friedman is a 34 y.o. female.  Laurie Friedman is a 34 y.o. female with a history of anxiety, otherwise healthy, who presents to the emergency department for evaluation of shortness of breath.  Patient states that for the past 4 days she found herself frequently feeling like she is out of breath or cannot catch her breath.  She states the symptoms happen with light activity, and sometimes when she is sitting still.  She denies any associated chest pain.  No fevers but patient does report that she has had an occasional cough and yesterday started coughing up some phlegm.  No lower extremity swelling or pain.  No lightheadedness or syncope.  She denies any pleuritic pain.  She states that this feels different than the symptoms she usually gets with her anxiety.  She does report that she is recently been moving into a new house after traveling here from [place] and has certainly had a lot on her plate, but is worried that these are the symptoms she typically has with her anxiety.  She denies any prior history of blood clots, is not on any birth control.  Recent travel down from [place], no recent surgeries.  No other aggravating or alleviating factors.  No history of heart disease.        History reviewed. No pertinent past medical history.  There are no problems to display for this patient.   History reviewed. No pertinent surgical history.   OB History   No obstetric history on file.     No family history on file.  Social History   Tobacco Use  . Smoking status: Never Smoker  Substance Use Topics  . Alcohol use: No  . Drug use: No    Home Medications Prior to Admission medications   Medication Sig Start Date End Date Taking? Authorizing Provider  amoxicillin (AMOXIL)  500 MG capsule Take 2 capsules (1,000 mg total) by mouth 3 (three) times daily for 5 days. 05/26/20 05/31/20  Jacqlyn Larsen, PA-C  azithromycin (ZITHROMAX) 250 MG tablet Take 1 tablet (250 mg total) by mouth daily. Take first 2 tablets together, then 1 every day until finished. 05/26/20   Jacqlyn Larsen, PA-C    Allergies    Patient has no known allergies.  Review of Systems   Review of Systems  Constitutional: Negative for chills and fever.  HENT: Negative.   Respiratory: Positive for shortness of breath. Negative for cough, chest tightness and wheezing.   Cardiovascular: Negative for chest pain, palpitations and leg swelling.  Gastrointestinal: Negative for abdominal pain, nausea and vomiting.  Genitourinary: Negative for dysuria and frequency.  Musculoskeletal: Negative for arthralgias and myalgias.  Skin: Negative for color change and rash.  Neurological: Negative for dizziness, syncope and light-headedness.  All other systems reviewed and are negative.   Physical Exam Updated Vital Signs BP 122/76 (BP Location: Right Arm)   Pulse (!) 102   Temp 98.5 F (36.9 C) (Oral)   Resp (!) 22   Ht 5\' 6"  (1.676 m)   Wt 54.4 kg   BMI 19.37 kg/m   Physical Exam Vitals and nursing note reviewed.  Constitutional:      General: She is not in acute distress.    Appearance: She is  well-developed and normal weight. She is not ill-appearing or diaphoretic.  HENT:     Head: Normocephalic and atraumatic.  Eyes:     General:        Right eye: No discharge.        Left eye: No discharge.     Pupils: Pupils are equal, round, and reactive to light.  Cardiovascular:     Rate and Rhythm: Normal rate and regular rhythm.     Pulses:          Radial pulses are 2+ on the right side and 2+ on the left side.       Dorsalis pedis pulses are 2+ on the right side and 2+ on the left side.     Heart sounds: Normal heart sounds. No murmur heard.  No friction rub. No gallop.   Pulmonary:     Effort:  Pulmonary effort is normal. No respiratory distress.     Breath sounds: Normal breath sounds. No wheezing or rales.     Comments: Respirations equal and unlabored, patient able to speak in full sentences, lungs clear to auscultation bilaterally Chest:     Chest wall: No tenderness.  Abdominal:     General: Bowel sounds are normal. There is no distension.     Palpations: Abdomen is soft. There is no mass.     Tenderness: There is no abdominal tenderness. There is no guarding.     Comments: Abdomen soft, nondistended, nontender to palpation in all quadrants without guarding or peritoneal signs  Musculoskeletal:        General: No deformity or signs of injury.     Cervical back: Neck supple.     Right lower leg: No tenderness. No edema.     Left lower leg: No tenderness. No edema.  Skin:    General: Skin is warm and dry.     Capillary Refill: Capillary refill takes less than 2 seconds.  Neurological:     Mental Status: She is alert.     Coordination: Coordination normal.     Comments: Speech is clear, able to follow commands Moves extremities without ataxia, coordination intact  Psychiatric:        Mood and Affect: Mood normal.        Behavior: Behavior normal.     ED Results / Procedures / Treatments   Labs (all labs ordered are listed, but only abnormal results are displayed) Labs Reviewed  BASIC METABOLIC PANEL - Abnormal; Notable for the following components:      Result Value   Potassium 3.4 (*)    Glucose, Bld 111 (*)    All other components within normal limits  D-DIMER, QUANTITATIVE (NOT AT [facility]) - Abnormal; Notable for the following components:   D-Dimer, Quant 0.70 (*)    All other components within normal limits  SARS CORONAVIRUS 2 BY RT PCR (HOSPITAL ORDER, [facility])  CBC  I-STAT BETA HCG BLOOD, ED ([facility], [facility], [facility] ONLY)  TROPONIN I (HIGH SENSITIVITY)    EKG None  Radiology DG Chest 2 View  Result Date: 05/26/2020 CLINICAL DATA:   Shortness of breath EXAM: CHEST - 2 VIEW COMPARISON:  None. FINDINGS: The heart size and mediastinal contours are within normal limits. Both lungs are clear. The visualized skeletal structures are unremarkable. IMPRESSION: No active cardiopulmonary disease. Electronically Signed   By: Prudencio Pair M.D.   On: 05/26/2020 15:03   CT Angio Chest PE W and/or Wo Contrast  Result Date:  05/26/2020 CLINICAL DATA:  Chest pain cough and fever EXAM: CT ANGIOGRAPHY CHEST WITH CONTRAST TECHNIQUE: Multidetector CT imaging of the chest was performed using the standard protocol during bolus administration of intravenous contrast. Multiplanar CT image reconstructions and MIPs were obtained to evaluate the vascular anatomy. CONTRAST:  3mL OMNIPAQUE IOHEXOL 350 MG/ML SOLN COMPARISON:  None. FINDINGS: Cardiovascular: There is a optimal opacification of the pulmonary arteries. There is no central,segmental, or subsegmental filling defects within the pulmonary arteries. The heart is normal in size. No pericardial effusion or thickening. No evidence right heart strain. There is normal three-vessel brachiocephalic anatomy without proximal stenosis. The thoracic aorta is normal in appearance. Mediastinum/Nodes: No hilar, mediastinal, or axillary adenopathy. Thyroid gland, trachea, and esophagus demonstrate no significant findings. Lungs/Pleura: Minimal tree-in-bud opacities are seen the right middle lobe and right lung base. No large airspace consolidation or pleural effusion. Mild biapical scarring is seen. No pleural effusion or pneumothorax. No airspace consolidation. Upper Abdomen: No acute abnormalities present in the visualized portions of the upper abdomen. Musculoskeletal: No chest wall abnormality. No acute or significant osseous findings. Review of the MIP images confirms the above findings. IMPRESSION: No central, segmental, or subsegmental pulmonary embolism. Nonspecific minimal tree-in-bud opacities at the right lung base  which may be due to infectious or inflammatory. Electronically Signed   By: Prudencio Pair M.D.   On: 05/26/2020 17:50    Procedures Procedures (including critical care time)  Medications Ordered in ED Medications  sodium chloride flush (NS) 0.9 % injection 3 mL (has no administration in time range)  iohexol (OMNIPAQUE) 350 MG/ML injection 75 mL (75 mLs Intravenous Contrast Given 05/26/20 1726)    ED Course  I have reviewed the triage vital signs and the nursing notes.  Pertinent labs & imaging results that were available during my care of the patient were reviewed by me and considered in my medical decision making (see chart for details).    MDM Rules/Calculators/A&P                          Patient presents to the emergency department with SOB, and describes it as feeling like she has not been able to catch her breath over the past few days. Patient nontoxic appearing, in no apparent distress, lungs are clear and she is not tachypneic or in any respiratory distress.  On arrival she was mildly tachycardic at 102 and mildly tachypneic but on my evaluation tachycardia has resolved.. Fairly benign physical exam.   DDX: pulmonary embolism, dissection, pneumothorax, pneumonia, ACS, arrhythmia, severe anemia, MSK, GERD, anxiety. Evaluation initiated with labs, EKG, and CXR. Patient on cardiac monitor.   CBC: No leukocytosis, normal hemoglobin BMP: Mild hypokalemia of 3.4, glucose 111, no other electrolyte derangements, normal renal function Troponin: Negative EKG: Sinus tachycardia, no ischemic changes  CXR:  Negative, without infiltrate, effusion, pneumothorax, or fracture/dislocation.  D-dimer:0.70, will proceed with CTA  CTA: With no evidence of PE, but does show some tree-in-bud changes that could be consistent with infection, patient did have some cough with phlegm, so this may be developing pneumonia.  EKG shows sinus tachycardia with no ischemic changes.  Aside from trip few months  ago from [place] patient does not have risk factors for PE, but was mildly tachy on arrival so is not PERC neg, D-dimer elevated, CTA Shows no evidence of PE but some signs of possible right lower lobe pneumonia.  Will treat with antibiotics for CAP.  Will send  off Covid swab as well as patient is not vaccinated.  Cardiac monitor reviewed, no notable arrhythmias or tachycardia. Patient has appeared hemodynamically stable throughout ER visit and appears safe for discharge with close PCP follow up. I discussed results, treatment plan, need for PCP follow-up, and return precautions with the patient. Provided opportunity for questions, patient confirmed understanding and is in agreement with plan.   Final Clinical Impression(s) / ED Diagnoses Final diagnoses:  SOB (shortness of breath)  Community acquired pneumonia of right lower lobe of lung    Rx / DC Orders ED Discharge Orders         Ordered    amoxicillin (AMOXIL) 500 MG capsule  3 times daily     Discontinue  Reprint     05/26/20 1842    azithromycin (ZITHROMAX) 250 MG tablet  Daily     Discontinue  Reprint     05/26/20 1842           Jacqlyn Larsen, PA-C 05/26/20 1844    Pattricia Boss, MD 05/27/20 0004

## 2020-06-21 ENCOUNTER — Emergency Department (HOSPITAL_COMMUNITY)
Admission: EM | Admit: 2020-06-21 | Discharge: 2020-06-22 | Disposition: A | Discharge status: Home / Self Care | Payer: Medicaid Other | Attending: Emergency Medicine | Admitting: Emergency Medicine

## 2020-06-21 ENCOUNTER — Other Ambulatory Visit: Payer: Self-pay

## 2020-06-21 ENCOUNTER — Encounter (HOSPITAL_COMMUNITY): Payer: Self-pay

## 2020-06-21 ENCOUNTER — Emergency Department (HOSPITAL_COMMUNITY): Payer: Medicaid Other

## 2020-06-21 DIAGNOSIS — R0602 Shortness of breath: Secondary | ICD-10-CM | POA: Diagnosis present

## 2020-06-21 DIAGNOSIS — Z5321 Procedure and treatment not carried out due to patient leaving prior to being seen by health care provider: Secondary | ICD-10-CM | POA: Insufficient documentation

## 2020-06-21 LAB — I-STAT BETA HCG BLOOD, ED (MC, WL, AP ONLY): I-stat hCG, quantitative: <5 m[IU]/mL (ref <5)

## 2020-06-21 LAB — CBC
HCT: 38.3 % (ref 36.0–46.0)
Hemoglobin: 12.2 g/dL (ref 12.0–15.0)
MCH: 28.6 pg (ref 26.0–34.0)
MCHC: 31.9 g/dL (ref 30.0–36.0)
MCV: 89.9 fL (ref 80.0–100.0)
Platelets: 221 10*3/uL (ref 150–400)
RBC: 4.26 MIL/uL (ref 3.87–5.11)
RDW: 14.1 % (ref 11.5–15.5)
WBC: 6.7 10*3/uL (ref 4.0–10.5)
nRBC: 0 % (ref 0.0–0.2)

## 2020-06-21 LAB — BASIC METABOLIC PANEL
Anion gap: 10 (ref 5–15)
BUN: 13 mg/dL (ref 6–20)
CO2: 21 mmol/L — ABNORMAL LOW (ref 22–32)
Calcium: 9.4 mg/dL (ref 8.9–10.3)
Chloride: 107 mmol/L (ref 98–111)
Creatinine, Ser: 0.93 mg/dL (ref 0.44–1.00)
GFR calc Af Amer: >60 mL/min (ref >60)
GFR calc non Af Amer: >60 mL/min (ref >60)
Glucose, Bld: 140 mg/dL — ABNORMAL HIGH (ref 70–99)
Potassium: 3.6 mmol/L (ref 3.5–5.1)
Sodium: 138 mmol/L (ref 135–145)

## 2020-06-21 LAB — TROPONIN I (HIGH SENSITIVITY): Troponin I (High Sensitivity): <2 ng/L (ref <18)

## 2020-06-21 NOTE — ED Triage Notes (Signed)
Pt arrives to ED w/ c/o sob since Monday. Pt seen here recently for the same. Pt denies chest pain, fever. Resp e/u.

## 2020-06-22 NOTE — ED Notes (Addendum)
Pt name called 3 times no response

## 2020-06-25 ENCOUNTER — Emergency Department (HOSPITAL_COMMUNITY)
Admission: EM | Admit: 2020-06-25 | Discharge: 2020-06-25 | Disposition: A | Discharge status: Home / Self Care | Payer: Medicaid Other | Attending: Emergency Medicine | Admitting: Emergency Medicine

## 2020-06-25 ENCOUNTER — Encounter (HOSPITAL_COMMUNITY): Payer: Self-pay | Admitting: Emergency Medicine

## 2020-06-25 ENCOUNTER — Emergency Department (HOSPITAL_COMMUNITY): Payer: Medicaid Other

## 2020-06-25 ENCOUNTER — Other Ambulatory Visit: Payer: Self-pay

## 2020-06-25 DIAGNOSIS — R0602 Shortness of breath: Secondary | ICD-10-CM | POA: Insufficient documentation

## 2020-06-25 DIAGNOSIS — Z5321 Procedure and treatment not carried out due to patient leaving prior to being seen by health care provider: Secondary | ICD-10-CM | POA: Diagnosis not present

## 2020-06-25 DIAGNOSIS — R05 Cough: Secondary | ICD-10-CM | POA: Diagnosis not present

## 2020-06-25 DIAGNOSIS — R0981 Nasal congestion: Secondary | ICD-10-CM | POA: Diagnosis not present

## 2020-06-25 HISTORY — DX: Tuberculosis of lung: A15.0

## 2020-06-25 NOTE — ED Triage Notes (Signed)
Pt reports 3 weeks ago had PNA took antibiotics for a week. States that felt better for a week then started back having SOB.  Reports that she went to another ED last Thursday but left due to wait times being so long. Has some cough and congestion.

## 2020-07-27 ENCOUNTER — Other Ambulatory Visit: Payer: Self-pay | Admitting: Endocrinology

## 2020-07-27 DIAGNOSIS — N83209 Unspecified ovarian cyst, unspecified side: Secondary | ICD-10-CM

## 2020-08-02 ENCOUNTER — Ambulatory Visit
Admission: RE | Admit: 2020-08-02 | Discharge: 2020-08-02 | Disposition: A | Discharge status: Home / Self Care | Payer: Medicaid Other | Source: Ambulatory Visit | Attending: Endocrinology | Admitting: Endocrinology

## 2020-08-02 DIAGNOSIS — N83209 Unspecified ovarian cyst, unspecified side: Secondary | ICD-10-CM

## 2020-08-08 ENCOUNTER — Encounter: Payer: Self-pay | Admitting: *Deleted

## 2020-08-28 ENCOUNTER — Encounter: Payer: Self-pay | Admitting: Student

## 2020-09-10 ENCOUNTER — Other Ambulatory Visit: Payer: Self-pay

## 2020-09-26 ENCOUNTER — Other Ambulatory Visit: Payer: Self-pay | Admitting: Endocrinology

## 2020-09-26 DIAGNOSIS — N6022 Fibroadenosis of left breast: Secondary | ICD-10-CM

## 2020-09-26 DIAGNOSIS — N6021 Fibroadenosis of right breast: Secondary | ICD-10-CM

## 2020-10-22 ENCOUNTER — Ambulatory Visit
Admission: RE | Admit: 2020-10-22 | Discharge: 2020-10-22 | Disposition: A | Discharge status: Home / Self Care | Payer: Medicaid Other | Source: Ambulatory Visit | Attending: Endocrinology | Admitting: Endocrinology

## 2020-10-22 ENCOUNTER — Other Ambulatory Visit: Payer: Self-pay

## 2020-10-22 DIAGNOSIS — N6022 Fibroadenosis of left breast: Secondary | ICD-10-CM

## 2020-10-22 DIAGNOSIS — N6021 Fibroadenosis of right breast: Secondary | ICD-10-CM

## 2022-01-13 ENCOUNTER — Other Ambulatory Visit: Payer: Self-pay | Admitting: Endocrinology

## 2022-01-13 DIAGNOSIS — D242 Benign neoplasm of left breast: Secondary | ICD-10-CM

## 2022-01-13 DIAGNOSIS — D241 Benign neoplasm of right breast: Secondary | ICD-10-CM

## 2022-02-05 ENCOUNTER — Other Ambulatory Visit: Payer: Medicaid Other

## 2022-02-06 ENCOUNTER — Other Ambulatory Visit: Payer: Medicaid Other

## 2022-02-12 ENCOUNTER — Ambulatory Visit
Admission: RE | Admit: 2022-02-12 | Discharge: 2022-02-12 | Disposition: A | Discharge status: Home / Self Care | Payer: Medicaid Other | Source: Ambulatory Visit | Attending: Endocrinology | Admitting: Endocrinology

## 2022-02-12 DIAGNOSIS — D241 Benign neoplasm of right breast: Secondary | ICD-10-CM

## 2022-03-10 ENCOUNTER — Ambulatory Visit: Payer: Self-pay | Admitting: Surgery

## 2022-04-22 ENCOUNTER — Encounter (HOSPITAL_BASED_OUTPATIENT_CLINIC_OR_DEPARTMENT_OTHER): Payer: Self-pay | Admitting: Surgery

## 2022-04-22 ENCOUNTER — Other Ambulatory Visit: Payer: Self-pay

## 2022-04-25 ENCOUNTER — Encounter (HOSPITAL_COMMUNITY): Payer: Self-pay | Admitting: Emergency Medicine

## 2022-04-25 ENCOUNTER — Ambulatory Visit (HOSPITAL_COMMUNITY)
Admission: EM | Admit: 2022-04-25 | Discharge: 2022-04-25 | Disposition: A | Discharge status: Home / Self Care | Payer: Medicaid Other | Attending: Physician Assistant | Admitting: Physician Assistant

## 2022-04-25 DIAGNOSIS — R051 Acute cough: Secondary | ICD-10-CM | POA: Diagnosis not present

## 2022-04-25 DIAGNOSIS — R0981 Nasal congestion: Secondary | ICD-10-CM | POA: Diagnosis not present

## 2022-04-25 DIAGNOSIS — F17210 Nicotine dependence, cigarettes, uncomplicated: Secondary | ICD-10-CM | POA: Diagnosis not present

## 2022-04-25 DIAGNOSIS — J069 Acute upper respiratory infection, unspecified: Secondary | ICD-10-CM | POA: Diagnosis present

## 2022-04-25 DIAGNOSIS — Z20822 Contact with and (suspected) exposure to covid-19: Secondary | ICD-10-CM | POA: Diagnosis not present

## 2022-04-25 MED ORDER — PROMETHAZINE-DM 6.25-15 MG/5ML PO SYRP: 5.0000 mL | ORAL_SOLUTION | Freq: Four times a day (QID) | ORAL | 0 refills | Status: DC | PRN

## 2022-04-25 MED ORDER — FLUTICASONE PROPIONATE 50 MCG/ACT NA SUSP: 1.0000 | Freq: Every day | NASAL | 0 refills | Status: DC

## 2022-04-25 NOTE — ED Provider Notes (Signed)
[place]    CSN: [id number] Arrival date & time: 04/25/22  1705      History   Chief Complaint Chief Complaint  Patient presents with   Sore Throat   Cough   Nasal Congestion    HPI CHALSEY LEETH is a 36 y.o. female.   Patient presents today with a 3-day history of URI symptoms.  Reports nasal congestion, cough, sore throat, body aches.  Denies any chest pain, shortness of breath, nausea, vomiting.  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  She has had COVID with last episode in October 2022.  She has not had COVID-19 vaccine.  She has not tried any over-the-counter medication for symptom management.  She is confident that she is not pregnant.  She was sent home from work and is requesting COVID-19 testing today.  She denies history of allergies, asthma, COPD.  She does smoke.  Denies any recent antibiotic or steroids.    Past Medical History:  Diagnosis Date   Anxiety    Fibroadenoma of breast     There are no problems to display for this patient.   Past Surgical History:  Procedure Laterality Date   BREAST LUMPECTOMY     x2   CESAREAN SECTION      OB History   No obstetric history on file.      Home Medications    Prior to Admission medications   Medication Sig Start Date End Date Taking? Authorizing Provider  fluticasone (FLONASE) 50 MCG/ACT nasal spray Place 1 spray into both nostrils daily. 04/25/22  Yes Neetu Carrozza K, PA-C  promethazine-dextromethorphan (PROMETHAZINE-DM) 6.25-15 MG/5ML syrup Take 5 mLs by mouth 4 (four) times daily as needed for cough. 04/25/22  Yes Gionna Polak, Derry Skill, PA-C    Family History Family History  Problem Relation Age of Onset   Breast cancer Neg Hx     Social History Social History   Tobacco Use   Smoking status: Every Day    Packs/day: 1.00    Types: Cigarettes   Smokeless tobacco: Never  Vaping Use   Vaping Use: Never used  Substance Use Topics   Alcohol use:  No   Drug use: No     Allergies   Patient has no known allergies.   Review of Systems Review of Systems  Constitutional:  Positive for activity change and fatigue. Negative for appetite change and fever.  HENT:  Positive for congestion and sore throat. Negative for sinus pressure and sneezing.   Respiratory:  Positive for cough. Negative for shortness of breath.   Cardiovascular:  Negative for chest pain.  Gastrointestinal:  Negative for abdominal pain, diarrhea, nausea and vomiting.  Neurological:  Positive for headaches. Negative for dizziness and light-headedness.     Physical Exam Triage Vital Signs ED Triage Vitals  Enc Vitals Group     BP 04/25/22 1723 95/64     Pulse Rate 04/25/22 1723 88     Resp 04/25/22 1723 16     Temp 04/25/22 1723 98.4 F (36.9 C)     Temp src --      SpO2 04/25/22 1723 98 %     Weight --      Height --      Head Circumference --      Peak Flow --      Pain Score 04/25/22 1721 3     Pain Loc --      Pain Edu? --  Excl. in GC? --    No data found.  Updated Vital Signs BP 95/64 (BP Location: Left Arm)   Pulse 88   Temp 98.4 F (36.9 C)   Resp 16   LMP 04/06/2022 (Exact Date)   SpO2 98%   Visual Acuity Right Eye Distance:   Left Eye Distance:   Bilateral Distance:    Right Eye Near:   Left Eye Near:    Bilateral Near:     Physical Exam Vitals reviewed.  Constitutional:      General: She is awake. She is not in acute distress.    Appearance: Normal appearance. She is well-developed. She is not ill-appearing.     Comments: Very pleasant female appears stated age in no acute distress sitting comfortably in exam room  HENT:     Head: Normocephalic and atraumatic.     Right Ear: Tympanic membrane, ear canal and external ear normal. Tympanic membrane is not erythematous or bulging.     Left Ear: Tympanic membrane, ear canal and external ear normal. Tympanic membrane is not erythematous or bulging.     Nose:     Right  Sinus: No maxillary sinus tenderness or frontal sinus tenderness.     Left Sinus: No maxillary sinus tenderness or frontal sinus tenderness.     Mouth/Throat:     Pharynx: Uvula midline. No oropharyngeal exudate or posterior oropharyngeal erythema.  Cardiovascular:     Rate and Rhythm: Normal rate and regular rhythm.     Heart sounds: Normal heart sounds, S1 normal and S2 normal. No murmur heard. Pulmonary:     Effort: Pulmonary effort is normal.     Breath sounds: Normal breath sounds. No wheezing, rhonchi or rales.     Comments: Clear to auscultation bilaterally Psychiatric:        Behavior: Behavior is cooperative.      UC Treatments / Results  Labs (all labs ordered are listed, but only abnormal results are displayed) Labs Reviewed  SARS CORONAVIRUS 2 (TAT 6-24 HRS)    EKG   Radiology No results found.  Procedures Procedures (including critical care time)  Medications Ordered in UC Medications - No data to display  Initial Impression / Assessment and Plan / UC Course  I have reviewed the triage vital signs and the nursing notes.  Pertinent labs & imaging results that were available during my care of the patient were reviewed by me and considered in my medical decision making (see chart for details).     Patient is well-appearing, afebrile, nontoxic, nontachycardic.  No indication for flu testing as patient been symptomatic for more than 72 hours and this would not change management.  COVID testing was obtained-results pending.  She was encouraged to monitor MyChart for these results.  No evidence of acute infection on physical exam that would warrant initiation of antibiotics.  Recommended conservative treatment measures.  She was given Flonase and Promethazine DM for symptom management.  Discussed that she should not drive or drink alcohol while taking Promethazine DM due to associated drowsiness.  She is to rest and drink plenty of fluids.  She was provided a work  excuse note with current CDC return to work guidelines based on COVID test result.  Discussed that if her symptoms persist or do not improve she should be reevaluated next week.  If she has any worsening symptoms including chest pain, shortness of breath, nausea/vomiting interfering with oral intake, weakness, fever not responding to medication she needs to be seen  immediately to which she expressed understanding.  Final Clinical Impressions(s) / UC Diagnoses   Final diagnoses:  Upper respiratory tract infection, unspecified type  Acute cough  Nasal congestion     Discharge Instructions      Monitor your MyChart for your COVID test results.  Use Flonase for cough and congestion.  I also recommend Promethazine DM for cough.  This will make you sleepy so do not drive or drink alcohol with taking it.  You can use over-the-counter medications including Mucinex and Tylenol for symptom relief.  Make sure you rest and drink plenty of fluid.  If you have any worsening symptoms you need to be reevaluated as we discussed.    ED Prescriptions     Medication Sig Dispense Auth. Provider   fluticasone (FLONASE) 50 MCG/ACT nasal spray Place 1 spray into both nostrils daily. 16 g Dimple Bastyr K, PA-C   promethazine-dextromethorphan (PROMETHAZINE-DM) 6.25-15 MG/5ML syrup Take 5 mLs by mouth 4 (four) times daily as needed for cough. 118 mL Rahman Ferrall K, PA-C      PDMP not reviewed this encounter.   Terrilee Croak, PA-C 04/25/22 1758

## 2022-04-25 NOTE — ED Triage Notes (Signed)
Pt reports for few days having cough, congestion, sore throat and body aches.  Pt got sent home from work and needs covid test.

## 2022-04-25 NOTE — Discharge Instructions (Signed)
Monitor your MyChart for your COVID test results.  Use Flonase for cough and congestion.  I also recommend Promethazine DM for cough.  This will make you sleepy so do not drive or drink alcohol with taking it.  You can use over-the-counter medications including Mucinex and Tylenol for symptom relief.  Make sure you rest and drink plenty of fluid.  If you have any worsening symptoms you need to be reevaluated as we discussed.

## 2022-04-26 LAB — SARS CORONAVIRUS 2 (TAT 6-24 HRS): SARS Coronavirus 2: NEGATIVE

## 2022-04-29 NOTE — Progress Notes (Signed)

## 2022-05-01 ENCOUNTER — Encounter (HOSPITAL_BASED_OUTPATIENT_CLINIC_OR_DEPARTMENT_OTHER): Payer: Self-pay | Admitting: Surgery

## 2022-05-01 ENCOUNTER — Other Ambulatory Visit: Payer: Self-pay

## 2022-05-01 ENCOUNTER — Encounter (HOSPITAL_BASED_OUTPATIENT_CLINIC_OR_DEPARTMENT_OTHER)
Admission: RE | Disposition: A | Discharge status: Home / Self Care | Payer: Self-pay | Source: Home / Self Care | Attending: Surgery

## 2022-05-01 ENCOUNTER — Ambulatory Visit (HOSPITAL_BASED_OUTPATIENT_CLINIC_OR_DEPARTMENT_OTHER)
Admission: RE | Admit: 2022-05-01 | Discharge: 2022-05-01 | Disposition: A | Discharge status: Home / Self Care | Payer: Medicaid Other | Attending: Surgery | Admitting: Surgery

## 2022-05-01 ENCOUNTER — Ambulatory Visit (HOSPITAL_BASED_OUTPATIENT_CLINIC_OR_DEPARTMENT_OTHER): Payer: Medicaid Other | Admitting: Anesthesiology

## 2022-05-01 DIAGNOSIS — N6320 Unspecified lump in the left breast, unspecified quadrant: Secondary | ICD-10-CM | POA: Insufficient documentation

## 2022-05-01 DIAGNOSIS — Z9889 Other specified postprocedural states: Secondary | ICD-10-CM | POA: Diagnosis not present

## 2022-05-01 DIAGNOSIS — N6022 Fibroadenosis of left breast: Secondary | ICD-10-CM | POA: Insufficient documentation

## 2022-05-01 DIAGNOSIS — Z01818 Encounter for other preprocedural examination: Secondary | ICD-10-CM

## 2022-05-01 DIAGNOSIS — F17210 Nicotine dependence, cigarettes, uncomplicated: Secondary | ICD-10-CM | POA: Insufficient documentation

## 2022-05-01 DIAGNOSIS — D242 Benign neoplasm of left breast: Secondary | ICD-10-CM | POA: Diagnosis present

## 2022-05-01 DIAGNOSIS — N6310 Unspecified lump in the right breast, unspecified quadrant: Secondary | ICD-10-CM | POA: Diagnosis not present

## 2022-05-01 HISTORY — PX: BREAST LUMPECTOMY: SHX2

## 2022-05-01 HISTORY — DX: Anxiety disorder, unspecified: F41.9

## 2022-05-01 HISTORY — DX: Benign neoplasm of unspecified breast: D24.9

## 2022-05-01 LAB — POCT PREGNANCY, URINE: Preg Test, Ur: NEGATIVE

## 2022-05-01 SURGERY — BREAST LUMPECTOMY
Anesthesia: General | Site: Breast | Laterality: Left

## 2022-05-01 MED ORDER — LACTATED RINGERS IV SOLN: INTRAVENOUS | Status: DC

## 2022-05-01 MED ORDER — DIPHENHYDRAMINE HCL 50 MG/ML IJ SOLN
INTRAMUSCULAR | Status: AC
  Filled 2022-05-01: qty 1

## 2022-05-01 MED ORDER — DEXAMETHASONE SODIUM PHOSPHATE 10 MG/ML IJ SOLN
INTRAMUSCULAR | Status: AC
  Filled 2022-05-01: qty 1

## 2022-05-01 MED ORDER — OXYCODONE HCL 5 MG/5ML PO SOLN: 5.0000 mg | Freq: Once | ORAL | Status: DC | PRN

## 2022-05-01 MED ORDER — MIDAZOLAM HCL 2 MG/2ML IJ SOLN
INTRAMUSCULAR | Status: AC
  Filled 2022-05-01: qty 2

## 2022-05-01 MED ORDER — CEFAZOLIN SODIUM-DEXTROSE 2-4 GM/100ML-% IV SOLN
INTRAVENOUS | Status: AC
  Filled 2022-05-01: qty 100

## 2022-05-01 MED ORDER — ACETAMINOPHEN 500 MG PO TABS
1000.0000 mg | ORAL_TABLET | ORAL | Status: AC
  Administered 2022-05-01: 1000 mg via ORAL

## 2022-05-01 MED ORDER — MEPERIDINE HCL 25 MG/ML IJ SOLN: 6.2500 mg | INTRAMUSCULAR | Status: DC | PRN

## 2022-05-01 MED ORDER — OXYCODONE HCL 5 MG PO TABS: 5.0000 mg | ORAL_TABLET | Freq: Four times a day (QID) | ORAL | 0 refills | Status: DC | PRN

## 2022-05-01 MED ORDER — FENTANYL CITRATE (PF) 100 MCG/2ML IJ SOLN
INTRAMUSCULAR | Status: AC
  Filled 2022-05-01: qty 2

## 2022-05-01 MED ORDER — ONDANSETRON HCL 4 MG/2ML IJ SOLN
INTRAMUSCULAR | Status: DC | PRN
  Administered 2022-05-01: 4 mg via INTRAVENOUS

## 2022-05-01 MED ORDER — LIDOCAINE 2% (20 MG/ML) 5 ML SYRINGE
INTRAMUSCULAR | Status: AC
  Filled 2022-05-01: qty 5

## 2022-05-01 MED ORDER — BUPIVACAINE-EPINEPHRINE (PF) 0.25% -1:200000 IJ SOLN
INTRAMUSCULAR | Status: AC
  Filled 2022-05-01: qty 30

## 2022-05-01 MED ORDER — DEXMEDETOMIDINE (PRECEDEX) IN NS 20 MCG/5ML (4 MCG/ML) IV SYRINGE
PREFILLED_SYRINGE | INTRAVENOUS | Status: AC
  Filled 2022-05-01: qty 5

## 2022-05-01 MED ORDER — PHENYLEPHRINE 80 MCG/ML (10ML) SYRINGE FOR IV PUSH (FOR BLOOD PRESSURE SUPPORT)
PREFILLED_SYRINGE | INTRAVENOUS | Status: DC | PRN
  Administered 2022-05-01 (×2): 80 ug via INTRAVENOUS
  Administered 2022-05-01: 160 ug via INTRAVENOUS
  Administered 2022-05-01 (×2): 80 ug via INTRAVENOUS

## 2022-05-01 MED ORDER — ACETAMINOPHEN 325 MG PO TABS: 325.0000 mg | ORAL_TABLET | ORAL | Status: DC | PRN

## 2022-05-01 MED ORDER — ACETAMINOPHEN 500 MG PO TABS
ORAL_TABLET | ORAL | Status: AC
  Filled 2022-05-01: qty 2

## 2022-05-01 MED ORDER — LIDOCAINE 2% (20 MG/ML) 5 ML SYRINGE
INTRAMUSCULAR | Status: DC | PRN
  Administered 2022-05-01: 60 mg via INTRAVENOUS

## 2022-05-01 MED ORDER — 0.9 % SODIUM CHLORIDE (POUR BTL) OPTIME
TOPICAL | Status: DC | PRN
  Administered 2022-05-01: 250 mL

## 2022-05-01 MED ORDER — IBUPROFEN 800 MG PO TABS: 800.0000 mg | ORAL_TABLET | Freq: Three times a day (TID) | ORAL | 0 refills | Status: DC | PRN

## 2022-05-01 MED ORDER — ACETAMINOPHEN 160 MG/5ML PO SOLN: 325.0000 mg | ORAL | Status: DC | PRN

## 2022-05-01 MED ORDER — DIPHENHYDRAMINE HCL 50 MG/ML IJ SOLN
12.5000 mg | Freq: Once | INTRAMUSCULAR | Status: AC
  Administered 2022-05-01: 12.5 mg via INTRAVENOUS

## 2022-05-01 MED ORDER — FENTANYL CITRATE (PF) 100 MCG/2ML IJ SOLN
25.0000 ug | INTRAMUSCULAR | Status: DC | PRN
  Administered 2022-05-01: 50 ug via INTRAVENOUS

## 2022-05-01 MED ORDER — PROPOFOL 10 MG/ML IV BOLUS
INTRAVENOUS | Status: DC | PRN
  Administered 2022-05-01: 150 mg via INTRAVENOUS
  Administered 2022-05-01: 30 mg via INTRAVENOUS

## 2022-05-01 MED ORDER — MIDAZOLAM HCL 5 MG/5ML IJ SOLN
INTRAMUSCULAR | Status: DC | PRN
  Administered 2022-05-01: 2 mg via INTRAVENOUS

## 2022-05-01 MED ORDER — CHLORHEXIDINE GLUCONATE CLOTH 2 % EX PADS: 6.0000 | MEDICATED_PAD | Freq: Once | CUTANEOUS | Status: DC

## 2022-05-01 MED ORDER — CEFAZOLIN SODIUM-DEXTROSE 2-4 GM/100ML-% IV SOLN
2.0000 g | INTRAVENOUS | Status: AC
  Administered 2022-05-01: 2 g via INTRAVENOUS

## 2022-05-01 MED ORDER — ONDANSETRON HCL 4 MG/2ML IJ SOLN
INTRAMUSCULAR | Status: AC
  Filled 2022-05-01: qty 2

## 2022-05-01 MED ORDER — SODIUM CHLORIDE 0.9 % IV SOLN: INTRAVENOUS | Status: DC | PRN

## 2022-05-01 MED ORDER — OXYCODONE HCL 5 MG PO TABS: 5.0000 mg | ORAL_TABLET | Freq: Once | ORAL | Status: DC | PRN

## 2022-05-01 MED ORDER — ONDANSETRON HCL 4 MG/2ML IJ SOLN: 4.0000 mg | Freq: Once | INTRAMUSCULAR | Status: DC | PRN

## 2022-05-01 MED ORDER — FENTANYL CITRATE (PF) 100 MCG/2ML IJ SOLN
INTRAMUSCULAR | Status: DC | PRN
  Administered 2022-05-01 (×2): 50 ug via INTRAVENOUS

## 2022-05-01 MED ORDER — CHLORHEXIDINE GLUCONATE CLOTH 2 % EX PADS
6.0000 | MEDICATED_PAD | Freq: Once | CUTANEOUS | Status: AC
  Administered 2022-05-01: 6 via TOPICAL

## 2022-05-01 MED ORDER — PHENYLEPHRINE 80 MCG/ML (10ML) SYRINGE FOR IV PUSH (FOR BLOOD PRESSURE SUPPORT)
PREFILLED_SYRINGE | INTRAVENOUS | Status: AC
  Filled 2022-05-01: qty 10

## 2022-05-01 MED ORDER — SODIUM CHLORIDE 0.9 % IV SOLN
INTRAVENOUS | Status: AC
  Filled 2022-05-01: qty 10

## 2022-05-01 MED ORDER — PROPOFOL 10 MG/ML IV BOLUS
INTRAVENOUS | Status: AC
  Filled 2022-05-01: qty 20

## 2022-05-01 MED ORDER — DEXMEDETOMIDINE (PRECEDEX) IN NS 20 MCG/5ML (4 MCG/ML) IV SYRINGE
PREFILLED_SYRINGE | INTRAVENOUS | Status: DC | PRN
  Administered 2022-05-01: 8 ug via INTRAVENOUS

## 2022-05-01 MED ORDER — BUPIVACAINE-EPINEPHRINE 0.25% -1:200000 IJ SOLN
INTRAMUSCULAR | Status: DC | PRN
  Administered 2022-05-01: 16 mL

## 2022-05-01 MED ORDER — DEXAMETHASONE SODIUM PHOSPHATE 4 MG/ML IJ SOLN
INTRAMUSCULAR | Status: DC | PRN
  Administered 2022-05-01: 10 mg via INTRAVENOUS

## 2022-05-01 SURGICAL SUPPLY — 53 items
ADH SKN CLS APL DERMABOND .7 (GAUZE/BANDAGES/DRESSINGS) ×1
APL PRP STRL LF DISP 70% ISPRP (MISCELLANEOUS) ×1
APPLIER CLIP 9.375 MED OPEN (MISCELLANEOUS)
APR CLP MED 9.3 20 MLT OPN (MISCELLANEOUS)
BINDER BREAST LRG (GAUZE/BANDAGES/DRESSINGS) IMPLANT
BINDER BREAST MEDIUM (GAUZE/BANDAGES/DRESSINGS) ×1 IMPLANT
BINDER BREAST XLRG (GAUZE/BANDAGES/DRESSINGS) IMPLANT
BINDER BREAST XXLRG (GAUZE/BANDAGES/DRESSINGS) IMPLANT
BLADE SURG 15 STRL LF DISP TIS (BLADE) ×1 IMPLANT
BLADE SURG 15 STRL SS (BLADE) ×2
CANISTER SUCT 1200ML W/VALVE (MISCELLANEOUS) ×2 IMPLANT
CHLORAPREP W/TINT 26 (MISCELLANEOUS) ×2 IMPLANT
CLIP APPLIE 9.375 MED OPEN (MISCELLANEOUS) IMPLANT
COVER BACK TABLE 60X90IN (DRAPES) ×2 IMPLANT
COVER MAYO STAND STRL (DRAPES) ×2 IMPLANT
DERMABOND ADVANCED (GAUZE/BANDAGES/DRESSINGS) ×1
DERMABOND ADVANCED .7 DNX12 (GAUZE/BANDAGES/DRESSINGS) ×1 IMPLANT
DRAPE LAPAROSCOPIC ABDOMINAL (DRAPES) IMPLANT
DRAPE LAPAROTOMY 100X72 PEDS (DRAPES) ×2 IMPLANT
DRAPE UTILITY XL STRL (DRAPES) ×2 IMPLANT
ELECT COATED BLADE 2.86 ST (ELECTRODE) ×2 IMPLANT
ELECT REM PT RETURN 9FT ADLT (ELECTROSURGICAL) ×2
ELECTRODE REM PT RTRN 9FT ADLT (ELECTROSURGICAL) ×1 IMPLANT
GLOVE BIOGEL PI IND STRL 6.5 (GLOVE) IMPLANT
GLOVE BIOGEL PI IND STRL 8 (GLOVE) ×1 IMPLANT
GLOVE BIOGEL PI INDICATOR 6.5 (GLOVE) ×1
GLOVE BIOGEL PI INDICATOR 8 (GLOVE) ×1
GLOVE ECLIPSE 8.0 STRL XLNG CF (GLOVE) ×2 IMPLANT
GLOVE SURG SS PI 6.0 STRL IVOR (GLOVE) ×1 IMPLANT
GLOVE SURG SS PI 6.5 STRL IVOR (GLOVE) ×1 IMPLANT
GOWN STRL REUS W/ TWL LRG LVL3 (GOWN DISPOSABLE) ×2 IMPLANT
GOWN STRL REUS W/ TWL XL LVL3 (GOWN DISPOSABLE) ×1 IMPLANT
GOWN STRL REUS W/TWL LRG LVL3 (GOWN DISPOSABLE) ×4
GOWN STRL REUS W/TWL XL LVL3 (GOWN DISPOSABLE) ×2
HEMOSTAT ARISTA ABSORB 3G PWDR (HEMOSTASIS) IMPLANT
KIT MARKER MARGIN INK (KITS) ×1 IMPLANT
NDL HYPO 25X1 1.5 SAFETY (NEEDLE) ×1 IMPLANT
NEEDLE HYPO 25X1 1.5 SAFETY (NEEDLE) ×2 IMPLANT
NS IRRIG 1000ML POUR BTL (IV SOLUTION) ×2 IMPLANT
PACK BASIN DAY SURGERY FS (CUSTOM PROCEDURE TRAY) ×2 IMPLANT
PENCIL SMOKE EVACUATOR (MISCELLANEOUS) ×2 IMPLANT
SLEEVE SCD COMPRESS KNEE MED (STOCKING) ×2 IMPLANT
SPIKE FLUID TRANSFER (MISCELLANEOUS) ×2 IMPLANT
SPONGE T-LAP 4X18 ~~LOC~~+RFID (SPONGE) ×2 IMPLANT
STAPLER VISISTAT 35W (STAPLE) IMPLANT
SUT MON AB 4-0 PC3 18 (SUTURE) ×2 IMPLANT
SUT SILK 2 0 SH (SUTURE) IMPLANT
SUT VICRYL 3-0 CR8 SH (SUTURE) ×2 IMPLANT
SYR CONTROL 10ML LL (SYRINGE) ×2 IMPLANT
TOWEL GREEN STERILE FF (TOWEL DISPOSABLE) ×4 IMPLANT
TRAY FAXITRON CT DISP (TRAY / TRAY PROCEDURE) IMPLANT
TUBE CONNECTING 20X1/4 (TUBING) ×2 IMPLANT
YANKAUER SUCT BULB TIP NO VENT (SUCTIONS) ×2 IMPLANT

## 2022-05-01 NOTE — Interval H&P Note (Signed)
History and Physical Interval Note:  05/01/2022 8:15 AM  Laurie Friedman  has presented today for surgery, with the diagnosis of LEFT FIBROADENOMA.  The various methods of treatment have been discussed with the patient and family. After consideration of risks, benefits and other options for treatment, the patient has consented to  Procedure(s): LEFT BREAST LUMPECTOMY (Left) as a surgical intervention.  The patient's history has been reviewed, patient examined, no change in status, stable for surgery.  I have reviewed the patient's chart and labs.  Questions were answered to the patient's satisfaction.     [place]

## 2022-05-01 NOTE — Transfer of Care (Signed)
Immediate Anesthesia Transfer of Care Note  Patient: Laurie Friedman  Procedure(s) Performed: LEFT BREAST LUMPECTOMY (Left: Breast)  Patient Location: PACU  Anesthesia Type:General  Level of Consciousness: drowsy  Airway & Oxygen Therapy: Patient Spontanous Breathing and Patient connected to face mask oxygen  Post-op Assessment: Report given to RN and Post -op Vital signs reviewed and stable  Post vital signs: Reviewed and stable  Last Vitals:  Vitals Value Taken Time  BP 87/58 05/01/22 0937  Temp    Pulse 52 05/01/22 0938  Resp 12 05/01/22 0938  SpO2 100 % 05/01/22 0938  Vitals shown include unvalidated device data.  Last Pain:  Vitals:   05/01/22 0754  TempSrc: Oral  PainSc: 0-No pain      Patients Stated Pain Goal: 3 (81/77/11 6579)  Complications: No notable events documented.

## 2022-05-01 NOTE — Op Note (Signed)
Preoperative diagnosis: Left breast fibroadenoma upper outer quadrant  Postoperative diagnosis: Same  Procedure: Left breast lumpectomy  Surgeon: Erroll Luna, MD  Anesthesia: LMA with 0.25% Marcaine plain  EBL: Minimal  Specimen: Left breast mass to pathology  Indications for procedure: The patient is a 36 year old female with a history of multiple fibroadenoma involving both breast.  She has had previous lumpectomies in the past.  These have all remained stable.  She has 1 in her left breast in the upper quadrant that measures about 2 cm in maximal diameter that is quite painful.  She also has very dense fibrocystic change in both breast.  We discussed the pros and cons of removal.  I explained this may not resolve her pain.  I also discussed observation since these have not changed and appear stable and are more than likely benign.  She has had previous lumpectomies in the past and has small breast size.  I explained there may be a significant cosmetic issue after surgery.  We also discussed the role of mastectomy with reconstruction given her small breast size and extremely dense and painful breast secondary to multiple fibroadenoma and fibrocystic change.  She would like to proceed with removal of the painful nodule for now understanding the above mentioned limitations.The procedure has been discussed with the patient. Alternatives to surgery have been discussed with the patient.  Risks of surgery include bleeding,  Infection,  Seroma formation, death,  and the need for further surgery.   The patient understands and wishes to proceed.     Description of procedure: The patient was met in the holding area and questions were answered.  The left breast was marked as the correct site and we both agreed to the area that was going to be removed and this was marked with the assistance of the patient.  She was then taken back to the operating room.  She was placed upon upon the OR table.  After  induction of general anesthesia, left breast was prepped and draped in a sterile fashion and timeout performed.  A curvilinear incision was made over the palpable mass.  All tissue around the mass was excised which included seated a significant amount of fibrocystic tissue with the fibroadenoma.  A wide margin was taken.  Hemostasis was achieved with cautery.  The specimen was oriented and sent to pathology.  Local anesthetic was infiltrated throughout the lumpectomy cavity.  We then closed the cavity with 3-0 Vicryl and 4-0 Monocryl.  Dermabond was applied.  All counts found to be correct.  The patient was awoke extubated taken to recovery in satisfactory condition.

## 2022-05-01 NOTE — Anesthesia Preprocedure Evaluation (Addendum)
Anesthesia Evaluation  Patient identified by MRN, date of birth, ID band Patient awake    Reviewed: Allergy & Precautions, H&P , NPO status , Patient's Chart, lab work & pertinent test results, reviewed documented beta blocker date and time   Airway Mallampati: I  TM Distance: >3 FB Neck ROM: full    Dental no notable dental hx. (+) Teeth Intact, Dental Advisory Given, Partial Upper   Pulmonary neg pulmonary ROS, Current Smoker,    Pulmonary exam normal breath sounds clear to auscultation       Cardiovascular Exercise Tolerance: Good negative cardio ROS   Rhythm:regular Rate:Normal     Neuro/Psych Anxiety negative neurological ROS  negative psych ROS   GI/Hepatic negative GI ROS, Neg liver ROS,   Endo/Other  negative endocrine ROS  Renal/GU negative Renal ROS  negative genitourinary   Musculoskeletal   Abdominal   Peds  Hematology negative hematology ROS (+)   Anesthesia Other Findings   Reproductive/Obstetrics negative OB ROS                            Anesthesia Physical Anesthesia Plan  ASA: 2  Anesthesia Plan: General   Post-op Pain Management: Minimal or no pain anticipated   Induction: Intravenous  PONV Risk Score and Plan: 3 and Ondansetron and Dexamethasone  Airway Management Planned: Oral ETT and LMA  Additional Equipment: None  Intra-op Plan:   Post-operative Plan: Extubation in OR  Informed Consent: I have reviewed the patients History and Physical, chart, labs and discussed the procedure including the risks, benefits and alternatives for the proposed anesthesia with the patient or authorized representative who has indicated his/her understanding and acceptance.     Dental Advisory Given  Plan Discussed with: CRNA and Anesthesiologist  Anesthesia Plan Comments: (  )        Anesthesia Quick Evaluation

## 2022-05-01 NOTE — Discharge Instructions (Addendum)
[facility] Office Phone Number [phone]  BREAST BIOPSY/ PARTIAL MASTECTOMY: POST OP INSTRUCTIONS  Always review your discharge instruction sheet given to you by the facility where your surgery was performed.  IF YOU HAVE DISABILITY OR FAMILY LEAVE FORMS, YOU MUST BRING THEM TO THE OFFICE FOR PROCESSING.  DO NOT GIVE THEM TO YOUR DOCTOR.  A prescription for pain medication may be given to you upon discharge.  Take your pain medication as prescribed, if needed.  If narcotic pain medicine is not needed, then you may take acetaminophen (Tylenol) or ibuprofen (Advil) as needed. Take your usually prescribed medications unless otherwise directed If you need a refill on your pain medication, please contact your pharmacy.  They will contact our office to request authorization.  Prescriptions will not be filled after 5pm or on week-ends. You should eat very light the first 24 hours after surgery, such as soup, crackers, pudding, etc.  Resume your normal diet the day after surgery. Most patients will experience some swelling and bruising in the breast.  Ice packs and a good support bra will help.  Swelling and bruising can take several days to resolve.  It is common to experience some constipation if taking pain medication after surgery.  Increasing fluid intake and taking a stool softener will usually help or prevent this problem from occurring.  A mild laxative (Milk of Magnesia or Miralax) should be taken according to package directions if there are no bowel movements after 48 hours. Unless discharge instructions indicate otherwise, you may remove your bandages 24-48 hours after surgery, and you may shower at that time.  You may have steri-strips (small skin tapes) in place directly over the incision.  These strips should be left on the skin for 7-10 days.  If your surgeon used skin glue on the incision, you may shower in 24 hours.  The glue will flake off over the next 2-3 weeks.  Any  sutures or staples will be removed at the office during your follow-up visit. ACTIVITIES:  You may resume regular daily activities (gradually increasing) beginning the next day.  Wearing a good support bra or sports bra minimizes pain and swelling.  You may have sexual intercourse when it is comfortable. You may drive when you no longer are taking prescription pain medication, you can comfortably wear a seatbelt, and you can safely maneuver your car and apply brakes. RETURN TO WORK:  ______________________________________________________________________________________ You should see your doctor in the office for a follow-up appointment approximately two weeks after your surgery.  Your doctor's nurse will typically make your follow-up appointment when she calls you with your pathology report.  Expect your pathology report 2-3 business days after your surgery.  You may call to check if you do not hear from us after three days. OTHER INSTRUCTIONS: _______________________________________________________________________________________________ _____________________________________________________________________________________________________________________________________ _____________________________________________________________________________________________________________________________________ _____________________________________________________________________________________________________________________________________  WHEN TO CALL YOUR DOCTOR: Fever over 101.0 Nausea and/or vomiting. Extreme swelling or bruising. Continued bleeding from incision. Increased pain, redness, or drainage from the incision.  The clinic staff is available to answer your questions during regular business hours.  Please don't hesitate to call and ask to speak to one of the nurses for clinical concerns.  If you have a medical emergency, go to the nearest emergency room or call 911.  A surgeon from [facility] is always on call at the hospital.  For further questions, please visit [email or web address]    Post Anesthesia Home Care Instructions  Activity: Get plenty of rest for the remainder of   remainder of the day. A responsible individual must stay with you for 24 hours following the procedure.  For the next 24 hours, DO NOT: -Drive a car -Paediatric nurse -Drink alcoholic beverages -Take any medication unless instructed by your physician -Make any legal decisions or sign important papers.  Meals: Start with liquid foods such as gelatin or soup. Progress to regular foods as tolerated. Avoid greasy, spicy, heavy foods. If nausea and/or vomiting occur, drink only clear liquids until the nausea and/or vomiting subsides. Call your physician if vomiting continues.  Special Instructions/Symptoms: Your throat may feel dry or sore from the anesthesia or the breathing tube placed in your throat during surgery. If this causes discomfort, gargle with warm salt water. The discomfort should disappear within 24 hours.  If you had a scopolamine patch placed behind your ear for the management of post- operative nausea and/or vomiting:  1. The medication in the patch is effective for 72 hours, after which it should be removed.  Wrap patch in a tissue and discard in the trash. Wash hands thoroughly with soap and water. 2. You may remove the patch earlier than 72 hours if you experience unpleasant side effects which may include dry mouth, dizziness or visual disturbances. 3. Avoid touching the patch. Wash your hands with soap and water after contact with the patch.   No tylenol until after 2pm today if needed.

## 2022-05-01 NOTE — H&P (Signed)
Chief Complaint: left breast masses   History of Present Illness: Laurie Friedman is a 36 y.o. female who is seen today as an office consultation for evaluation of left breast masses .   Patient presents for evaluation of painful left breast mass. She has a history of fibroadenomas. She had previous excision of those in both breast. She had a recent ultrasound demonstrating fibroadenoma left breast upper outer quadrant. She has multiple other masses in both breasts. There is not painful but the one in the left breast upper outer quadrant is.  Review of Systems: A complete review of systems was obtained from the patient. I have reviewed this information and discussed as appropriate with the patient. See HPI as well for other ROS.    Medical History: Past Medical History:  Diagnosis Date   Anemia   Anxiety   There is no problem list on file for this patient.  Past Surgical History:  Procedure Laterality Date   CESAREAN SECTION 2008   BREAST EXCISIONAL BIOPSY 2015    No Known Allergies  No current outpatient medications on file prior to visit.   No current facility-administered medications on file prior to visit.   No family history on file.   Social History   Tobacco Use  Smoking Status Every Day   Packs/day: 1.00   Types: Cigarettes  Smokeless Tobacco Never    Social History   Socioeconomic History   Marital status: Single  Tobacco Use   Smoking status: Every Day  Packs/day: 1.00  Types: Cigarettes   Smokeless tobacco: Never  Vaping Use   Vaping Use: Never used  Substance and Sexual Activity   Alcohol use: Never   Drug use: Never   Objective:   Vitals:  03/10/22 1525  BP: 118/70  Pulse: 95  Temp: 36.8 C (98.3 F)  SpO2: 98%  Weight: 60 kg (132 lb 6 oz)  Height: 174 cm (5' 8.5")   Body mass index is 19.83 kg/m.  Physical Exam Eyes:  Pupils: Pupils are equal, round, and reactive to light.  Cardiovascular:  Rate and Rhythm: Normal rate.   Pulmonary:  Effort: Pulmonary effort is normal.  Chest:   Comments: 4 cm mobile left breast mass upper outer quadrant consistent with fibroadenoma  Both breasts are small quite dense making examination less sensitive Musculoskeletal:  Cervical back: Normal range of motion.  Lymphadenopathy:  Upper Body:  Right upper body: No supraclavicular or axillary adenopathy.  Left upper body: No supraclavicular or axillary adenopathy.  Skin: General: Skin is warm.  Neurological:  General: No focal deficit present.  Mental Status: She is alert.  Psychiatric:  Mood and Affect: Mood normal.  Behavior: Behavior normal.     Labs, Imaging and Diagnostic Testing: CLINICAL DATA: Follow-up for probably benign masses within each breast, initially identified on outside ultrasound exams dated 04/05/2019 and 12/19/2019. These masses were most recently evaluated on diagnostic exam dated 10/22/2020 with recommendation at that time for a six-month follow-up exam to ensure stability. Patient returns today for the follow-up exam.   Patient has a history of multiple excisional biopsies of the skin and 2 prior excisional biopsies of the breast.   EXAM: DIGITAL DIAGNOSTIC BILATERAL MAMMOGRAM WITH TOMOSYNTHESIS AND CAD; ULTRASOUND RIGHT BREAST LIMITED; ULTRASOUND LEFT BREAST LIMITED   TECHNIQUE: Bilateral digital diagnostic mammography and breast tomosynthesis was performed. The images were evaluated with computer-aided detection.; Targeted ultrasound examination of the right breast was performed; Targeted ultrasound examination of the left breast was performed.   COMPARISON:  Previous exam(s).   ACR Breast Density Category d: The breast tissue is extremely dense, which lowers the sensitivity of mammography.   FINDINGS: Bilateral diagnostic mammogram: There are no new dominant masses, suspicious calcifications or secondary signs of malignancy within either breast.   RIGHT breast: Targeted  ultrasound is performed, again showing oval hypoechoic masses in the RIGHT breast at the 12 o'clock and 2 o'clock axes, not significantly changed confirming benignity, presumed fibroadenomas. Benign cluster of cysts in the RIGHT breast at the 10 o'clock have decreased in size, also confirming benignity.   LEFT breast: Targeted ultrasound is performed, again showing oval hypoechoic masses in the LEFT breast at the 12 o'clock and 7 o'clock axes, also not significantly changed confirming benignity, also presumed fibroadenomas.   IMPRESSION: 1. Multiple benign masses within each breast, all stable compared to previous exams confirming benignity, presumed fibroadenomas. No additional follow-up diagnostic imaging is necessary for these benign findings. 2. No evidence of malignancy within either breast.   RECOMMENDATION: 1. Patient states that the benign mass within the outer LEFT breast is painful. Patient requests surgical consultation for possible excision. Patient will be connected with [facility] by our breast navigation team. 2. Screening mammogram at age 17 unless there are persistent or intervening clinical concerns. (Code:[id number]-B-40A) 3. The patient was instructed to return sooner if the area that she feels becomes larger and/or firmer to palpation, or if a new palpable abnormality is identified in either breast.   I have discussed the findings and recommendations with the patient. If applicable, a reminder letter will be sent to the patient regarding the next appointment.   BI-RADS CATEGORY 2: Benign.   Electronically Signed: By: Franki Cabot M.D. On: 02/12/2022 09:53  Assessment and Plan:   Diagnoses and all orders for this visit:  Mass of upper outer quadrant of left breast    Findings consistent with fibroadenoma. She has a history of multiple other lumpectomies and is familiar with the procedure. This is quite painful and she would like to have it  removed. Location left breast upper quadrant measures about 4 cm in maximal diameter on exam. Risks and benefits of surgery as well as the long-term expected outcome. Cosmetic issues discussed. Complications include bleeding, infection, swelling and the need for the treatments and/or procedures. She is small breasted and could have cosmetic issues but this is the upper outer quadrant and she is aware of this and she has had multiple lumpectomies before. She understands that these are benign and do not have to be removed but this is causing quite discomfort and she would like to proceed

## 2022-05-01 NOTE — Anesthesia Postprocedure Evaluation (Signed)
Anesthesia Post Note  Patient: Laurie Friedman  Procedure(s) Performed: LEFT BREAST LUMPECTOMY (Left: Breast)     Patient location during evaluation: PACU Anesthesia Type: General Level of consciousness: awake and alert Pain management: pain level controlled Vital Signs Assessment: post-procedure vital signs reviewed and stable Respiratory status: spontaneous breathing, nonlabored ventilation, respiratory function stable and patient connected to nasal cannula oxygen Cardiovascular status: blood pressure returned to baseline and stable Postop Assessment: no apparent nausea or vomiting Anesthetic complications: no   No notable events documented.  Last Vitals:  Vitals:   05/01/22 0754 05/01/22 0937  BP: 90/62 (!) 87/58  Pulse: 73 (!) 52  Resp: 16 12  Temp: (!) 36.3 C   SpO2: 100% 100%    Last Pain:  Vitals:   05/01/22 0754  TempSrc: Oral  PainSc: 0-No pain                 Charnel Giles

## 2022-05-01 NOTE — Anesthesia Procedure Notes (Addendum)
Procedure Name: LMA Insertion Date/Time: 05/01/2022 8:55 AM  Performed by: Ezequiel Kayser, CRNAPre-anesthesia Checklist: Patient identified, Emergency Drugs available, Suction available and Patient being monitored Patient Re-evaluated:Patient Re-evaluated prior to induction Oxygen Delivery Method: Circle System Utilized Preoxygenation: Pre-oxygenation with 100% oxygen Induction Type: IV induction Ventilation: Mask ventilation without difficulty LMA: LMA inserted LMA Size: 4.0 Number of attempts: 1 Placement Confirmation: positive ETCO2 Tube secured with: Tape Dental Injury: Teeth and Oropharynx as per pre-operative assessment

## 2022-05-02 ENCOUNTER — Encounter (HOSPITAL_BASED_OUTPATIENT_CLINIC_OR_DEPARTMENT_OTHER): Payer: Self-pay | Admitting: Surgery

## 2022-05-05 LAB — SURGICAL PATHOLOGY

## 2022-05-07 ENCOUNTER — Encounter: Payer: Self-pay | Admitting: Surgery

## 2022-05-09 IMAGING — US US BREAST*R* LIMITED INC AXILLA
1 series · 15 of 15 positions shown · non-contrast
Comparison: Previous exam(s).
COMPARISON: Previous exam(s).

Addendum:
CLINICAL DATA: Follow-up for probably benign masses within each
breast, initially identified on outside ultrasound exams dated
04/05/2019 and 12/19/2019. These masses were most recently evaluated
on diagnostic exam dated 10/22/2020 with recommendation at that time
for a six-month follow-up exam to ensure stability. Patient returns
today for the follow-up exam.

Patient has a history of multiple excisional biopsies of the skin
and 2 prior excisional biopsies of the breast.
EXAM:
DIGITAL DIAGNOSTIC BILATERAL MAMMOGRAM WITH TOMOSYNTHESIS AND CAD;
ULTRASOUND RIGHT BREAST LIMITED; ULTRASOUND LEFT BREAST LIMITED
TECHNIQUE: Bilateral digital diagnostic mammography and breast tomosynthesis
was performed. The images were evaluated with computer-aided
detection.; Targeted ultrasound examination of the right breast was
performed; Targeted ultrasound examination of the left breast was
performed.

[Series 1: us breast*right* limited inc axilla · 0.05mm/px · 15 of 15 slices shown]
[im 1/15]
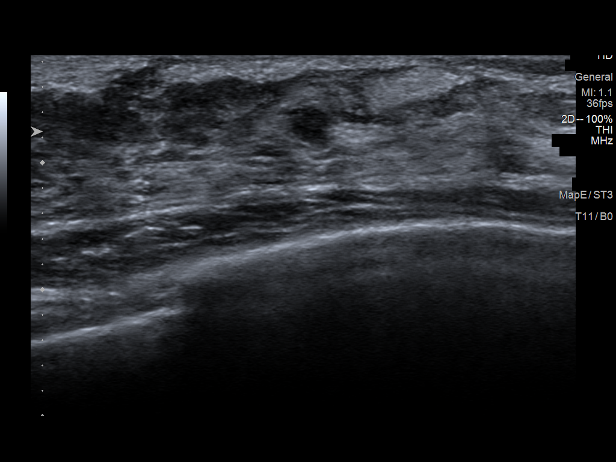
[im 2/15]
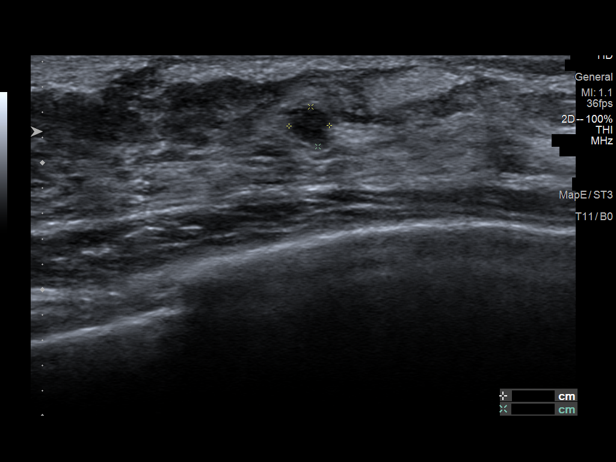
[im 3/15]
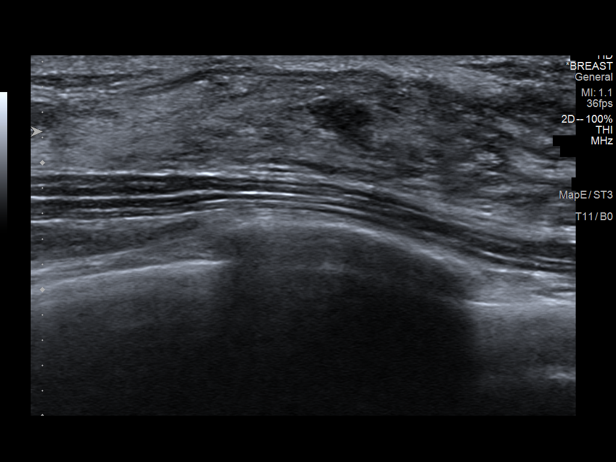
[im 4/15]
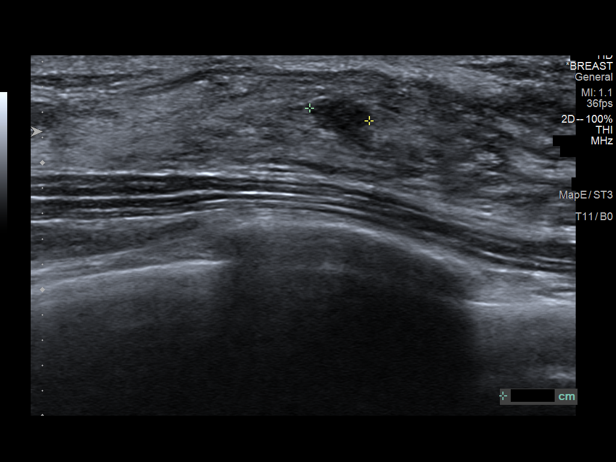
[im 5/15]
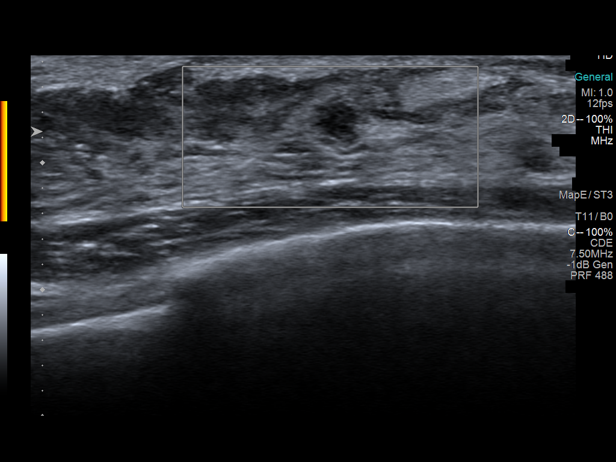
[im 6/15]
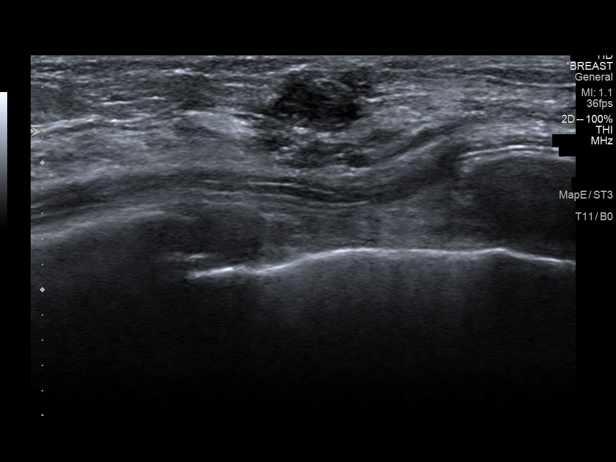
[im 7/15]
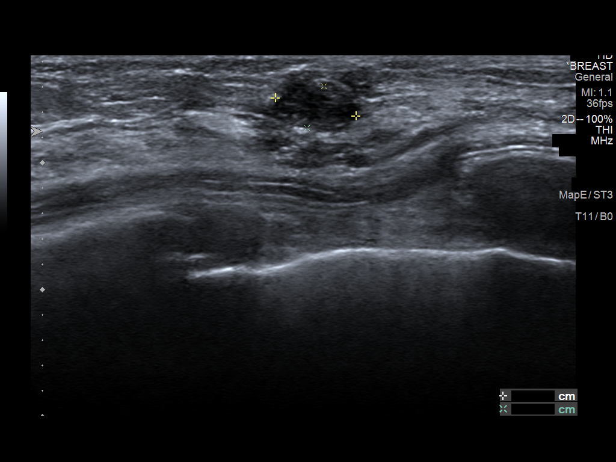
[im 8/15]
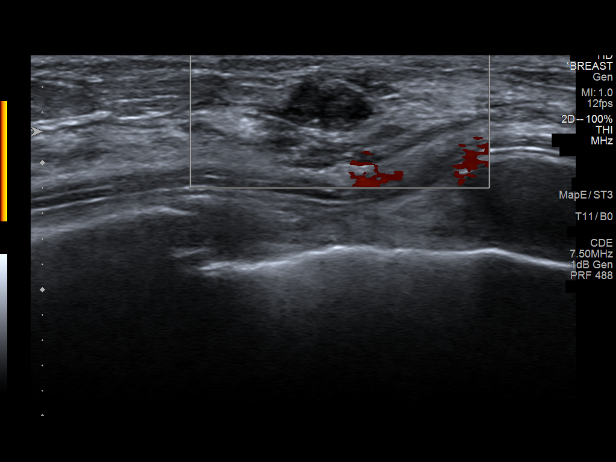
[im 9/15]
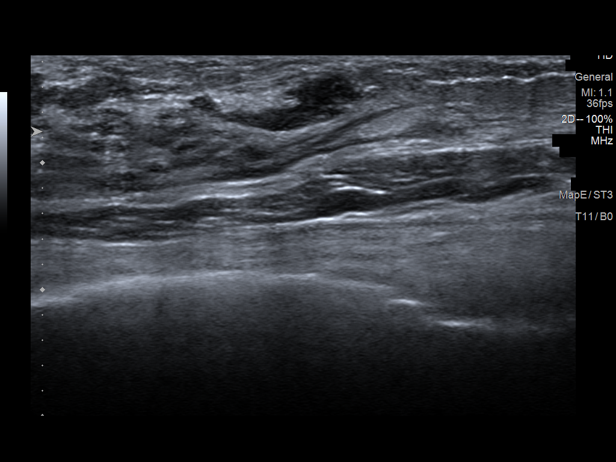
[im 10/15]
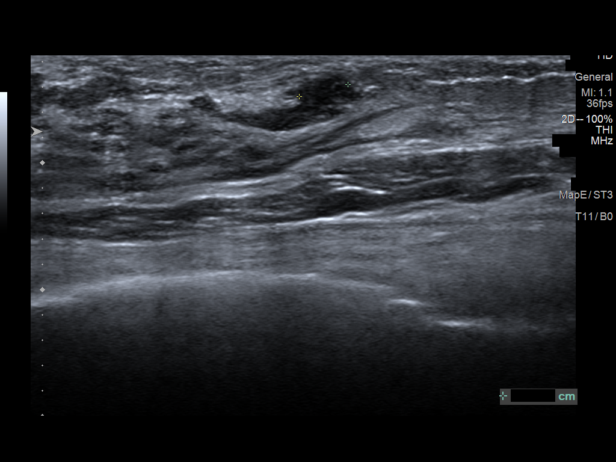
[im 11/15]
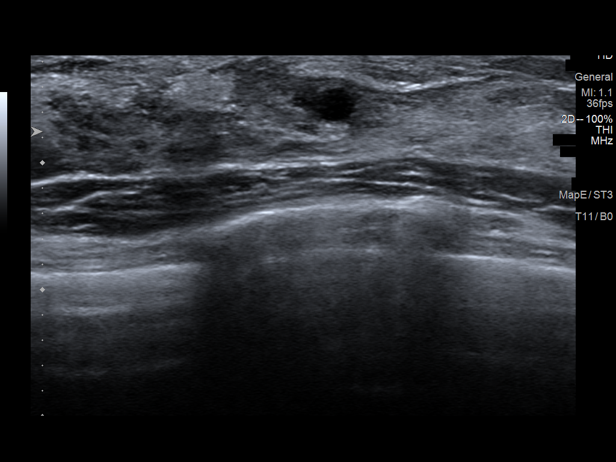
[im 12/15]
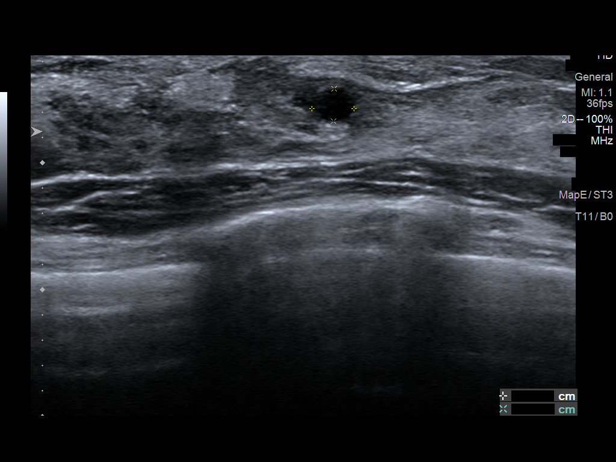
[im 13/15]
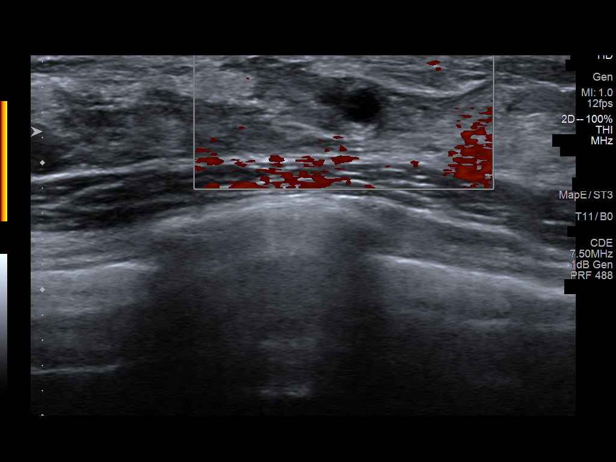
[im 14/15]
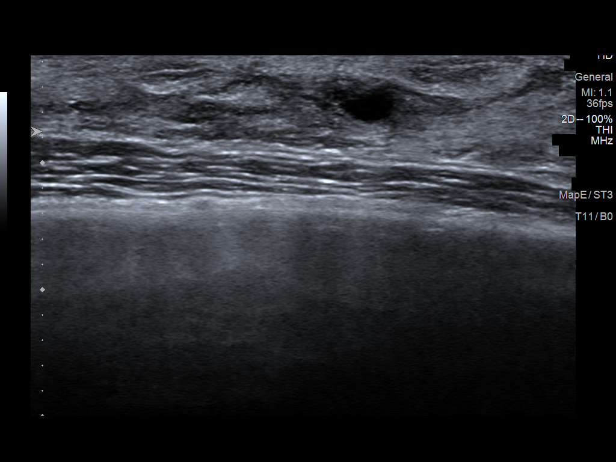
[im 15/15]
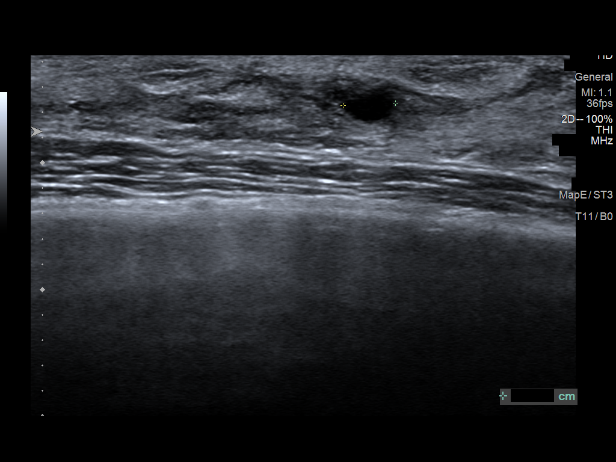

[15 of 15 positions shown; findings below may reference images not displayed]

ACR Breast Density Category d: The breast tissue is extremely dense,
which lowers the sensitivity of mammography.
FINDINGS: Bilateral diagnostic mammogram: There are no new dominant masses,
suspicious calcifications or secondary signs of malignancy within
either breast.

RIGHT breast: Targeted ultrasound is performed, again showing oval
hypoechoic masses in the RIGHT breast at the 12 o'clock and 2
o'clock axes, not significantly changed confirming benignity,
presumed fibroadenomas. Benign cluster of cysts in the RIGHT breast
at the 10 o'clock have decreased in size, also confirming benignity.

LEFT breast: Targeted ultrasound is performed, again showing oval
hypoechoic masses in the LEFT breast at the 12 o'clock and 7 o'clock
axes, also not significantly changed confirming benignity, also
presumed fibroadenomas.
IMPRESSION: 1. Multiple benign masses within each breast, all stable compared to
previous exams confirming benignity, presumed fibroadenomas. No
additional follow-up diagnostic imaging is necessary for these
benign findings.
2. No evidence of malignancy within either breast.

RECOMMENDATION:
1. Patient states that the benign mass within the outer LEFT breast
is painful. Patient requests surgical consultation for possible
excision. Patient will be connected with [REDACTED] by
our breast navigation team.
2. Screening mammogram at age 41 unless there are persistent or
intervening clinical concerns. (Code:SS-[id number])
3. The patient was instructed to return sooner if the area that she
feels becomes larger and/or firmer to palpation, or if a new
palpable abnormality is identified in either breast.

I have discussed the findings and recommendations with the patient.
If applicable, a reminder letter will be sent to the patient
regarding the next appointment.

BI-RADS CATEGORY  2: Benign.

ADDENDUM:
Surgical consultation has been arranged with Dr. Nuno Silva Houy at
[REDACTED] on March 10, 2022.

Brisn Kimbro RN on 02/13/2022

*** End of Addendum ***
ACR Breast Density Category d: The breast tissue is extremely dense,
which lowers the sensitivity of mammography.
FINDINGS: Bilateral diagnostic mammogram: There are no new dominant masses,
suspicious calcifications or secondary signs of malignancy within
either breast.

RIGHT breast: Targeted ultrasound is performed, again showing oval
hypoechoic masses in the RIGHT breast at the 12 o'clock and 2
o'clock axes, not significantly changed confirming benignity,
presumed fibroadenomas. Benign cluster of cysts in the RIGHT breast
at the 10 o'clock have decreased in size, also confirming benignity.

LEFT breast: Targeted ultrasound is performed, again showing oval
hypoechoic masses in the LEFT breast at the 12 o'clock and 7 o'clock
axes, also not significantly changed confirming benignity, also
presumed fibroadenomas.
IMPRESSION: 1. Multiple benign masses within each breast, all stable compared to
previous exams confirming benignity, presumed fibroadenomas. No
additional follow-up diagnostic imaging is necessary for these
benign findings.
2. No evidence of malignancy within either breast.

RECOMMENDATION:
1. Patient states that the benign mass within the outer LEFT breast
is painful. Patient requests surgical consultation for possible
excision. Patient will be connected with [REDACTED] by
our breast navigation team.
2. Screening mammogram at age 41 unless there are persistent or
intervening clinical concerns. (Code:SS-[id number])
3. The patient was instructed to return sooner if the area that she
feels becomes larger and/or firmer to palpation, or if a new
palpable abnormality is identified in either breast.

I have discussed the findings and recommendations with the patient.
If applicable, a reminder letter will be sent to the patient
regarding the next appointment.

BI-RADS CATEGORY  2: Benign.

## 2022-05-09 IMAGING — MG DIGITAL DIAGNOSTIC BILAT W/ TOMO W/ CAD
5 of 8 series · 5 of 24 positions shown · non-contrast
Comparison: Previous exam(s).
COMPARISON: Previous exam(s).

Addendum:
CLINICAL DATA: Follow-up for probably benign masses within each
breast, initially identified on outside ultrasound exams dated
04/05/2019 and 12/19/2019. These masses were most recently evaluated
on diagnostic exam dated 10/22/2020 with recommendation at that time
for a six-month follow-up exam to ensure stability. Patient returns
today for the follow-up exam.

Patient has a history of multiple excisional biopsies of the skin
and 2 prior excisional biopsies of the breast.
EXAM:
DIGITAL DIAGNOSTIC BILATERAL MAMMOGRAM WITH TOMOSYNTHESIS AND CAD;
ULTRASOUND RIGHT BREAST LIMITED; ULTRASOUND LEFT BREAST LIMITED
TECHNIQUE: Bilateral digital diagnostic mammography and breast tomosynthesis
was performed. The images were evaluated with computer-aided
detection.; Targeted ultrasound examination of the right breast was
performed; Targeted ultrasound examination of the left breast was
performed.

[L CC synth-2D]
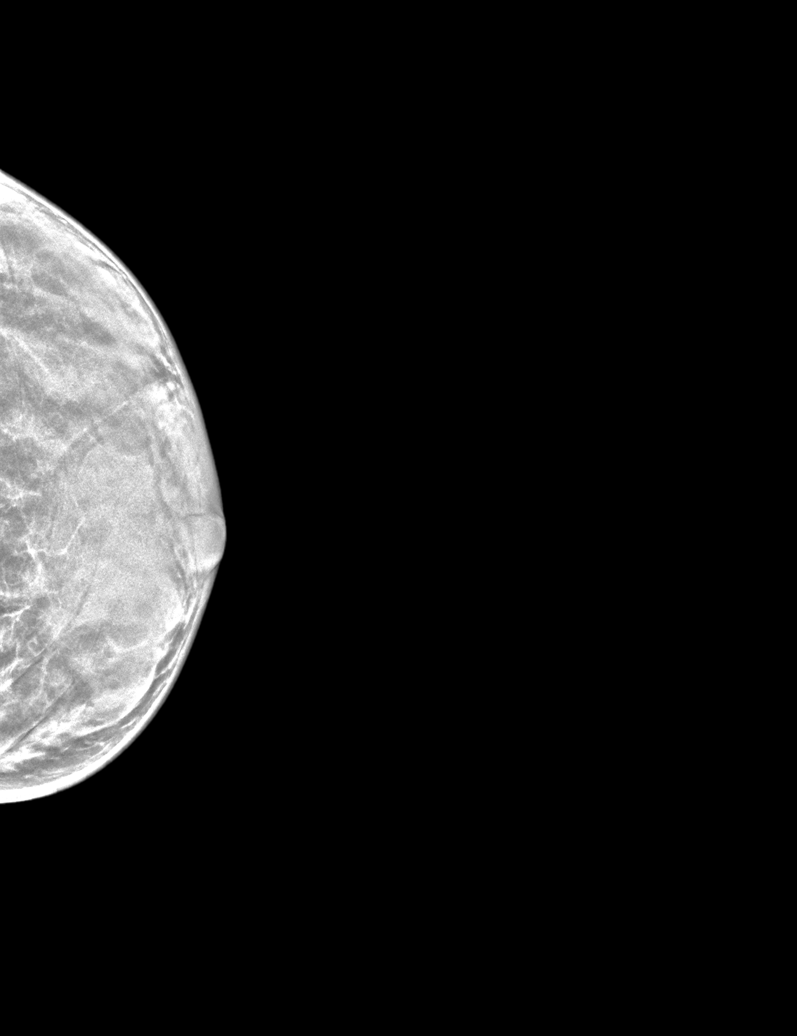

[L MLO synth-2D]
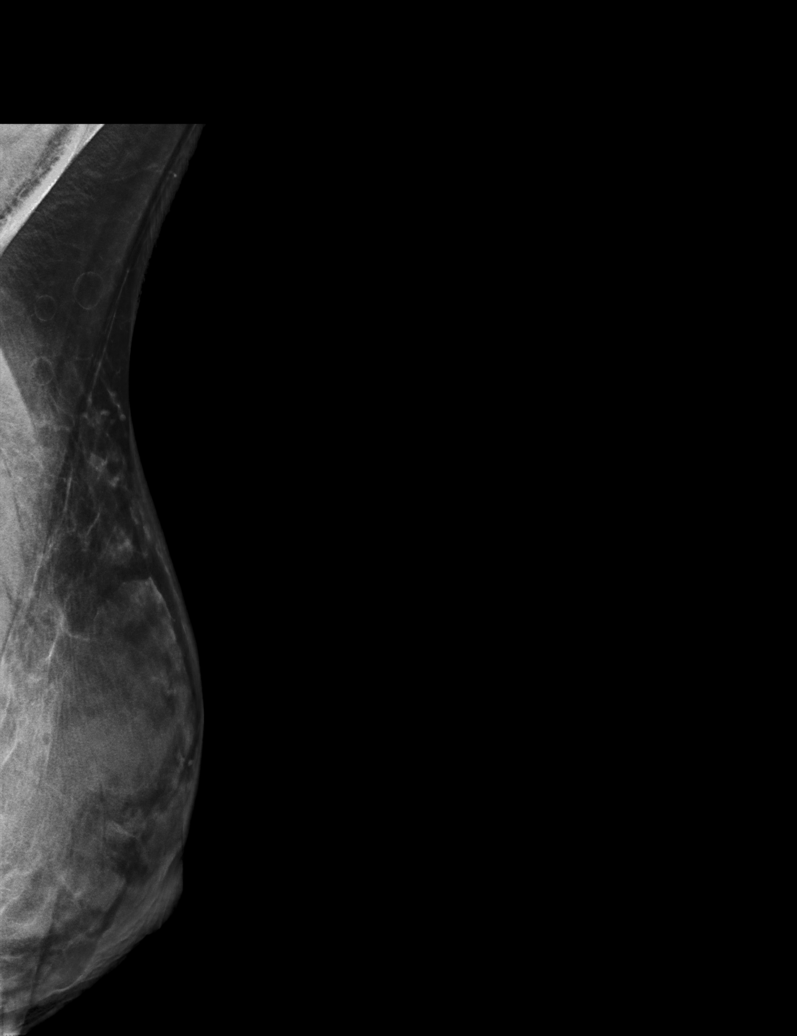

[R MLO synth-2D]
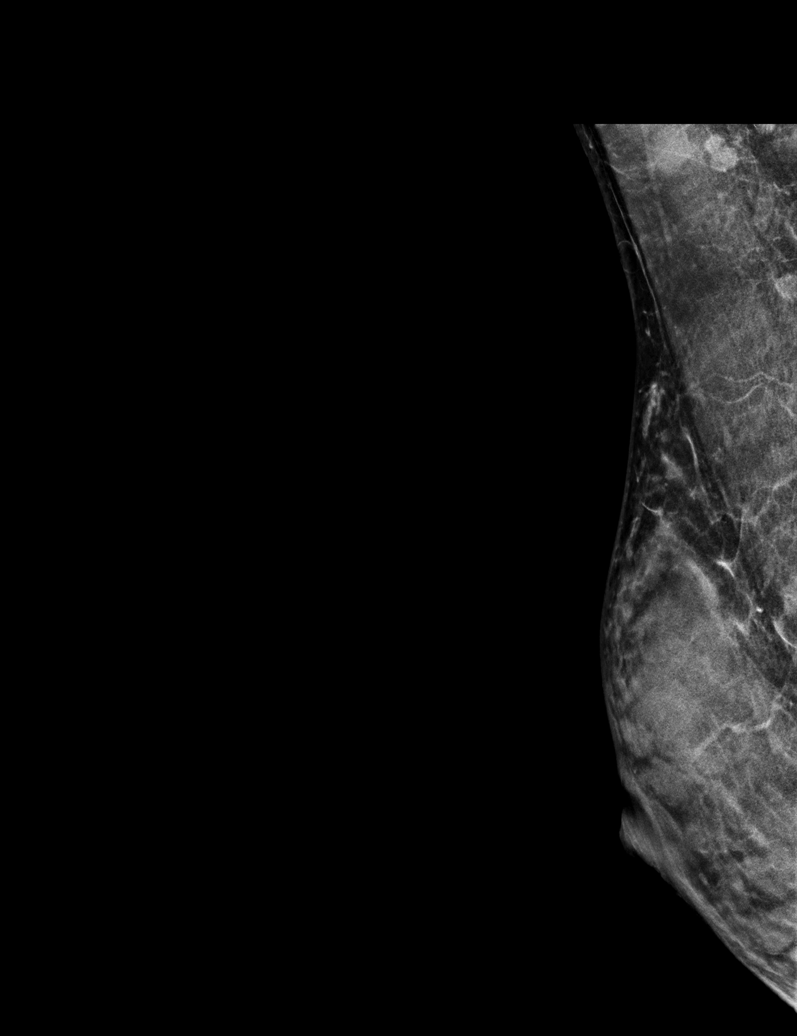

[R CC synth-2D]
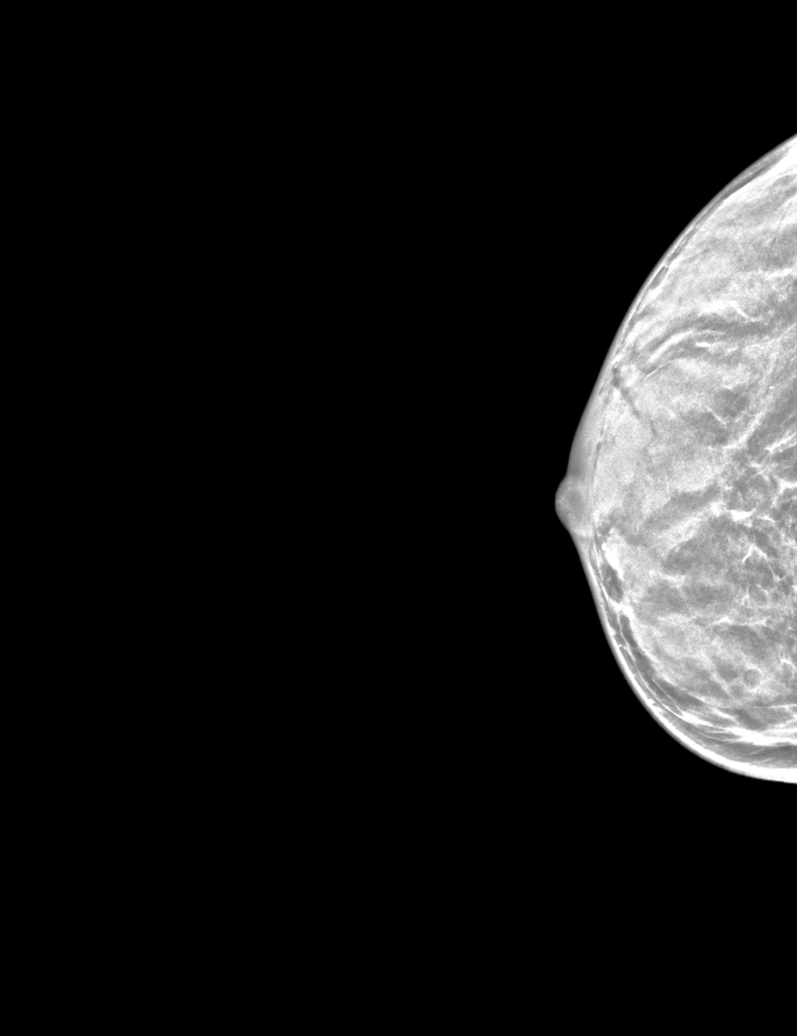

[L CC tomo · tomo slice 19/38.0]
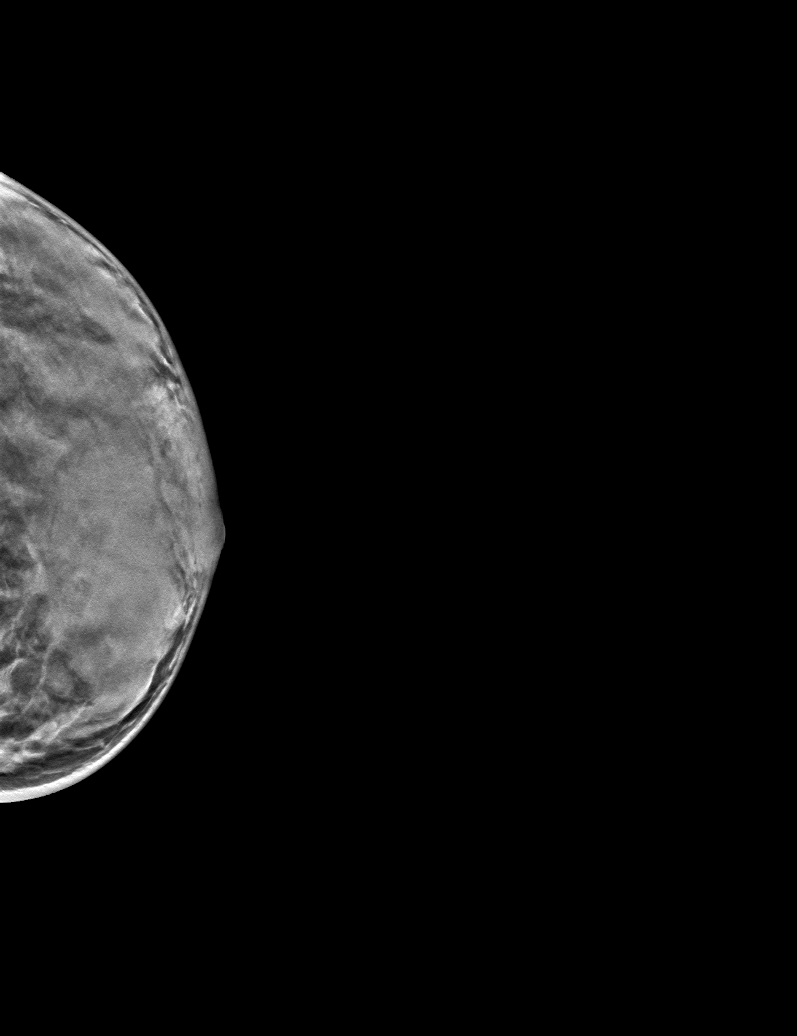

[5 of 24 positions shown; findings below may reference images not displayed]

ACR Breast Density Category d: The breast tissue is extremely dense,
which lowers the sensitivity of mammography.
FINDINGS: Bilateral diagnostic mammogram: There are no new dominant masses,
suspicious calcifications or secondary signs of malignancy within
either breast.

RIGHT breast: Targeted ultrasound is performed, again showing oval
hypoechoic masses in the RIGHT breast at the 12 o'clock and 2
o'clock axes, not significantly changed confirming benignity,
presumed fibroadenomas. Benign cluster of cysts in the RIGHT breast
at the 10 o'clock have decreased in size, also confirming benignity.

LEFT breast: Targeted ultrasound is performed, again showing oval
hypoechoic masses in the LEFT breast at the 12 o'clock and 7 o'clock
axes, also not significantly changed confirming benignity, also
presumed fibroadenomas.
IMPRESSION: 1. Multiple benign masses within each breast, all stable compared to
previous exams confirming benignity, presumed fibroadenomas. No
additional follow-up diagnostic imaging is necessary for these
benign findings.
2. No evidence of malignancy within either breast.

RECOMMENDATION:
1. Patient states that the benign mass within the outer LEFT breast
is painful. Patient requests surgical consultation for possible
excision. Patient will be connected with [REDACTED] by
our breast navigation team.
2. Screening mammogram at age 41 unless there are persistent or
intervening clinical concerns. (Code:SS-7-F2C)
3. The patient was instructed to return sooner if the area that she
feels becomes larger and/or firmer to palpation, or if a new
palpable abnormality is identified in either breast.

I have discussed the findings and recommendations with the patient.
If applicable, a reminder letter will be sent to the patient
regarding the next appointment.

BI-RADS CATEGORY  2: Benign.

ADDENDUM:
Surgical consultation has been arranged with Dr. Nuno Silva Houy at
[REDACTED] on March 10, 2022.

Brisn Kimbro RN on 02/13/2022

*** End of Addendum ***
ACR Breast Density Category d: The breast tissue is extremely dense,
which lowers the sensitivity of mammography.
FINDINGS: Bilateral diagnostic mammogram: There are no new dominant masses,
suspicious calcifications or secondary signs of malignancy within
either breast.

RIGHT breast: Targeted ultrasound is performed, again showing oval
hypoechoic masses in the RIGHT breast at the 12 o'clock and 2
o'clock axes, not significantly changed confirming benignity,
presumed fibroadenomas. Benign cluster of cysts in the RIGHT breast
at the 10 o'clock have decreased in size, also confirming benignity.

LEFT breast: Targeted ultrasound is performed, again showing oval
hypoechoic masses in the LEFT breast at the 12 o'clock and 7 o'clock
axes, also not significantly changed confirming benignity, also
presumed fibroadenomas.
IMPRESSION: 1. Multiple benign masses within each breast, all stable compared to
previous exams confirming benignity, presumed fibroadenomas. No
additional follow-up diagnostic imaging is necessary for these
benign findings.
2. No evidence of malignancy within either breast.

RECOMMENDATION:
1. Patient states that the benign mass within the outer LEFT breast
is painful. Patient requests surgical consultation for possible
excision. Patient will be connected with [REDACTED] by
our breast navigation team.
2. Screening mammogram at age 41 unless there are persistent or
intervening clinical concerns. (Code:SS-7-F2C)
3. The patient was instructed to return sooner if the area that she
feels becomes larger and/or firmer to palpation, or if a new
palpable abnormality is identified in either breast.

I have discussed the findings and recommendations with the patient.
If applicable, a reminder letter will be sent to the patient
regarding the next appointment.

BI-RADS CATEGORY  2: Benign.

## 2022-05-24 ENCOUNTER — Emergency Department (HOSPITAL_COMMUNITY)
Admission: EM | Admit: 2022-05-24 | Discharge: 2022-05-24 | Discharge status: Against Medical Advice | Payer: Medicaid Other

## 2022-05-24 NOTE — ED Notes (Signed)
Called pt x3 for triage, no response. 

## 2022-09-01 ENCOUNTER — Inpatient Hospital Stay (HOSPITAL_COMMUNITY): Payer: Medicaid Other

## 2022-09-01 ENCOUNTER — Encounter (HOSPITAL_COMMUNITY): Payer: Self-pay | Admitting: *Deleted

## 2022-09-01 ENCOUNTER — Inpatient Hospital Stay (HOSPITAL_COMMUNITY)
Admission: AD | Admit: 2022-09-01 | Discharge: 2022-09-01 | Disposition: A | Discharge status: Home / Self Care | Payer: Medicaid Other | Attending: Obstetrics & Gynecology | Admitting: Obstetrics & Gynecology

## 2022-09-01 DIAGNOSIS — Z3A01 Less than 8 weeks gestation of pregnancy: Secondary | ICD-10-CM | POA: Diagnosis present

## 2022-09-01 DIAGNOSIS — O3680X0 Pregnancy with inconclusive fetal viability, not applicable or unspecified: Secondary | ICD-10-CM

## 2022-09-01 DIAGNOSIS — O209 Hemorrhage in early pregnancy, unspecified: Secondary | ICD-10-CM | POA: Diagnosis not present

## 2022-09-01 HISTORY — DX: Urinary tract infection, site not specified: N39.0

## 2022-09-01 HISTORY — DX: Anemia, unspecified: D64.9

## 2022-09-01 LAB — URINALYSIS, ROUTINE W REFLEX MICROSCOPIC
Bilirubin Urine: NEGATIVE
Glucose, UA: NEGATIVE mg/dL
Hgb urine dipstick: NEGATIVE
Ketones, ur: 5 mg/dL — AB
Leukocytes,Ua: NEGATIVE
Nitrite: NEGATIVE
Protein, ur: NEGATIVE mg/dL
Specific Gravity, Urine: 1.027 (ref 1.005–1.030)
pH: 5 (ref 5.0–8.0)

## 2022-09-01 LAB — URINALYSIS, COMPLETE (UACMP) WITH MICROSCOPIC
Bilirubin Urine: NEGATIVE
Glucose, UA: NEGATIVE mg/dL
Hgb urine dipstick: NEGATIVE
Ketones, ur: NEGATIVE mg/dL
Leukocytes,Ua: NEGATIVE
Nitrite: NEGATIVE
Protein, ur: NEGATIVE mg/dL
Specific Gravity, Urine: 1.027 (ref 1.005–1.030)
pH: 5 (ref 5.0–8.0)

## 2022-09-01 LAB — ABO/RH: ABO/RH(D): O POS

## 2022-09-01 LAB — CBC
HCT: 32.1 % — ABNORMAL LOW (ref 36.0–46.0)
Hemoglobin: 11 g/dL — ABNORMAL LOW (ref 12.0–15.0)
MCH: 30.8 pg (ref 26.0–34.0)
MCHC: 34.3 g/dL (ref 30.0–36.0)
MCV: 89.9 fL (ref 80.0–100.0)
Platelets: 211 10*3/uL (ref 150–400)
RBC: 3.57 MIL/uL — ABNORMAL LOW (ref 3.87–5.11)
RDW: 13.1 % (ref 11.5–15.5)
WBC: 5.1 10*3/uL (ref 4.0–10.5)
nRBC: 0 % (ref 0.0–0.2)

## 2022-09-01 LAB — WET PREP, GENITAL
Sperm: NONE SEEN
Trich, Wet Prep: NONE SEEN
WBC, Wet Prep HPF POC: <10 (ref <10)
Yeast Wet Prep HPF POC: NONE SEEN

## 2022-09-01 LAB — HCG, QUANTITATIVE, PREGNANCY: hCG, Beta Chain, Quant, S: 1209 m[IU]/mL — ABNORMAL HIGH (ref <5)

## 2022-09-01 LAB — POCT PREGNANCY, URINE: Preg Test, Ur: POSITIVE — AB

## 2022-09-01 NOTE — Discharge Instructions (Signed)
Safe Medications in Pregnancy    Acne: Benzoyl Peroxide Salicylic Acid  Backache/Headache: Tylenol: 2 regular strength every 4 hours OR              2 Extra strength every 6 hours  Colds/Coughs/Allergies: Benadryl (alcohol free) 25 mg every 6 hours as needed Breath right strips Claritin Cepacol throat lozenges Chloraseptic throat spray Cold-Eeze- up to three times per day Cough drops, alcohol free Flonase (by prescription only) Guaifenesin Mucinex Robitussin DM (plain only, alcohol free) Saline nasal spray/drops Sudafed (pseudoephedrine) & Actifed ** use only after [redacted] weeks gestation and if you do not have high blood pressure Tylenol Vicks Vaporub Zinc lozenges Zyrtec   Constipation: Colace Ducolax suppositories Fleet enema Glycerin suppositories Metamucil Milk of magnesia Miralax Senokot Smooth move tea  Diarrhea: Kaopectate Imodium A-D  *NO pepto Bismol  Hemorrhoids: Anusol Anusol HC Preparation H Tucks  Indigestion: Tums Maalox Mylanta Zantac  Pepcid  Insomnia: Benadryl (alcohol free) 25mg every 6 hours as needed Tylenol PM Unisom, no Gelcaps  Leg Cramps: Tums MagGel  Nausea/Vomiting:  Bonine Dramamine Emetrol Ginger extract Sea bands Meclizine  Nausea medication to take during pregnancy:  Unisom (doxylamine succinate 25 mg tablets) Take one tablet daily at bedtime. If symptoms are not adequately controlled, the dose can be increased to a maximum recommended dose of two tablets daily (1/2 tablet in the morning, 1/2 tablet mid-afternoon and one at bedtime). Vitamin B6 100mg tablets. Take one tablet twice a day (up to 200 mg per day).  Skin Rashes: Aveeno products Benadryl cream or 25mg every 6 hours as needed Calamine Lotion 1% cortisone cream  Yeast infection: Gyne-lotrimin 7 Monistat 7   **If taking multiple medications, please check labels to avoid duplicating the same active ingredients **take  medication as directed on the label ** Do not exceed 4000 mg of tylenol in 24 hours **Do not take medications that contain aspirin or ibuprofen   Prenatal Care Providers           [facility]  [address] [phone]  [facility] @ [facility]   [address]  [phone]  [facility] @ [place]       [address] [phone]            [facility] @ [place]     [address] [phone]          [facility] @ [place]   [address] [phone]  [facility] @ [facility]  [address] [phone]     [facility] @ [facility] ([place])  [address]   [phone]     [facility]  Phone: [phone]  [facility]  Phone: [phone]  [facility] Phone: [phone]  [facility] Phone: [phone]  [facility] Phone: [phone]  [facility] Phone: [phone]  [facility]  Phone: [phone]  

## 2022-09-01 NOTE — MAU Note (Signed)
Laurie Friedman is a 36 y.o. at Unknown here in MAU reporting: since the 19th has been cramping and having brown spotting, started as light pink. +HPT on the 20th. LMP: ~9/10 Onset of complaint: 19th Pain score: mild, dull Vitals:   09/01/22 1613  BP: 99/61  Pulse: 86  Resp: 16  Temp: 98.5 F (36.9 C)  SpO2: 100%      Lab orders placed from triage:  UPT< UA

## 2022-09-02 LAB — GC/CHLAMYDIA PROBE AMP (~~LOC~~) NOT AT ARMC
Chlamydia: NEGATIVE
Comment: NEGATIVE
Comment: NORMAL
Neisseria Gonorrhea: NEGATIVE

## 2022-09-07 ENCOUNTER — Inpatient Hospital Stay (HOSPITAL_COMMUNITY)
Admission: AD | Admit: 2022-09-07 | Discharge: 2022-09-07 | Disposition: A | Discharge status: Home / Self Care | Payer: Medicaid Other | Attending: Obstetrics and Gynecology | Admitting: Obstetrics and Gynecology

## 2022-09-07 DIAGNOSIS — O209 Hemorrhage in early pregnancy, unspecified: Secondary | ICD-10-CM | POA: Diagnosis not present

## 2022-09-07 DIAGNOSIS — O09521 Supervision of elderly multigravida, first trimester: Secondary | ICD-10-CM | POA: Insufficient documentation

## 2022-09-07 DIAGNOSIS — R109 Unspecified abdominal pain: Secondary | ICD-10-CM | POA: Diagnosis not present

## 2022-09-07 DIAGNOSIS — Z3A01 Less than 8 weeks gestation of pregnancy: Secondary | ICD-10-CM | POA: Diagnosis not present

## 2022-09-07 DIAGNOSIS — N898 Other specified noninflammatory disorders of vagina: Secondary | ICD-10-CM | POA: Diagnosis not present

## 2022-09-07 DIAGNOSIS — O26891 Other specified pregnancy related conditions, first trimester: Secondary | ICD-10-CM | POA: Insufficient documentation

## 2022-09-07 LAB — CBC
HCT: 35.2 % — ABNORMAL LOW (ref 36.0–46.0)
Hemoglobin: 11.7 g/dL — ABNORMAL LOW (ref 12.0–15.0)
MCH: 30.2 pg (ref 26.0–34.0)
MCHC: 33.2 g/dL (ref 30.0–36.0)
MCV: 90.7 fL (ref 80.0–100.0)
Platelets: 217 10*3/uL (ref 150–400)
RBC: 3.88 MIL/uL (ref 3.87–5.11)
RDW: 13.2 % (ref 11.5–15.5)
WBC: 6.1 10*3/uL (ref 4.0–10.5)
nRBC: 0 % (ref 0.0–0.2)

## 2022-09-07 LAB — URINALYSIS, ROUTINE W REFLEX MICROSCOPIC
Bilirubin Urine: NEGATIVE
Glucose, UA: NEGATIVE mg/dL
Ketones, ur: NEGATIVE mg/dL
Nitrite: NEGATIVE
Protein, ur: NEGATIVE mg/dL
Specific Gravity, Urine: <1.005 — ABNORMAL LOW (ref 1.005–1.030)
pH: 6 (ref 5.0–8.0)

## 2022-09-07 LAB — HCG, QUANTITATIVE, PREGNANCY: hCG, Beta Chain, Quant, S: 7789 m[IU]/mL — ABNORMAL HIGH (ref <5)

## 2022-09-07 LAB — URINALYSIS, MICROSCOPIC (REFLEX)

## 2022-09-07 NOTE — MAU Note (Signed)
Pt reports to mau with c/o ongoing vag spotting when wiping.  Pt states she was in mau last week for the same complaints of cramping and bleeding.  Pt states neither have changed.  Reports no pain now but states she has abd pain every morning before urinating and late at night.  Last intercourse 10/27.  Reports she only sees spotting when she has a "large cramp" and then wipes.

## 2022-09-07 NOTE — MAU Provider Note (Signed)
History     CSN: [id number]  Arrival date and time: 09/07/22 5726   Event Date/Time   First Provider Initiated Contact with Patient 09/07/22 1234      Chief Complaint  Patient presents with   Vaginal Bleeding   HPI Laurie Friedman is a 36 y.o. [id number] at 70w0dwho presents to MAU with chief complaint of spotting.  She is only seeing pink spotting when she wipes after voiding. Sometimes the spotting is preceded by a strong "crampy" sensation. Typically occurs first thing in the morning or at the end of the day. She is constantly wearing a pantyliner to track her spotting and sometimes the liner stays clean. Both her cramping and her spotting have been present since 08/28/2022 and were included in her previous MAU evaluation 09/01/2022. She denies dysuria, abdominal tenderness, fever or recent illness.  Patient is scheduled for repeat viability ultrasound on 09/16/2022.  OB History     Gravida  7   Para  5   Term  5   Preterm      AB  1   Living  5      SAB  1   IAB      Ectopic      Multiple      Live Births  5        Obstetric Comments  Pt was pushing, fh was dropping- ended up with c/s 2013 baby was face up, had problems getting the shoulders out, was bleeding, got a shot         Past Medical History:  Diagnosis Date   Anemia    Anxiety    Fibroadenoma of breast    UTI (urinary tract infection)     Past Surgical History:  Procedure Laterality Date   BREAST LUMPECTOMY     x2   BREAST LUMPECTOMY Left 05/01/2022   Procedure: LEFT BREAST LUMPECTOMY;  Surgeon: CErroll Luna MD;  [facility]  Service: General;  Laterality: Left;   CESAREAN SECTION      Family History  Problem Relation Age of Onset   Healthy Mother    Healthy Father    Breast cancer Neg Hx     Social History   Tobacco Use   Smoking status: Every Day    Packs/day: 1.00    Years: 8.00    Total pack years: 8.00    Types: Cigarettes   Smokeless  tobacco: Never  Vaping Use   Vaping Use: Never used  Substance Use Topics   Alcohol use: No   Drug use: No    Allergies: No Known Allergies  No medications prior to admission.    Review of Systems  Gastrointestinal:  Positive for abdominal pain.  Genitourinary:  Positive for vaginal bleeding.  All other systems reviewed and are negative.  Physical Exam   Blood pressure (!) 87/53, pulse 81, temperature 98.2 F (36.8 C), temperature source Oral, resp. rate 15, last menstrual period 07/20/2022, SpO2 100 %.  Physical Exam Vitals and nursing note reviewed. Exam conducted with a chaperone present.  Cardiovascular:     Rate and Rhythm: Normal rate.  Pulmonary:     Effort: Pulmonary effort is normal.  Skin:    Capillary Refill: Capillary refill takes less than 2 seconds.  Neurological:     Mental Status: She is alert and oriented to person, place, and time.  Psychiatric:        Mood and Affect: Mood normal.  Behavior: Behavior normal.        Thought Content: Thought content normal.        Judgment: Judgment normal.     MAU Course  Procedures  MDM Patient Vitals for the past 24 hrs:  BP Temp Temp src Pulse Resp SpO2  09/07/22 1049 (!) 87/53 98.2 F (36.8 C) Oral 81 15 100 %   Results for orders placed or performed during the hospital encounter of 09/07/22 (from the past 24 hour(s))  Urinalysis, Routine w reflex microscopic Urine, Clean Catch     Status: Abnormal   Collection Time: 09/07/22 10:45 AM  Result Value Ref Range   Color, Urine YELLOW YELLOW   APPearance CLEAR CLEAR   Specific Gravity, Urine <1.005 (L) 1.005 - 1.030   pH 6.0 5.0 - 8.0   Glucose, UA NEGATIVE NEGATIVE mg/dL   Hgb urine dipstick TRACE (A) NEGATIVE   Bilirubin Urine NEGATIVE NEGATIVE   Ketones, ur NEGATIVE NEGATIVE mg/dL   Protein, ur NEGATIVE NEGATIVE mg/dL   Nitrite NEGATIVE NEGATIVE   Leukocytes,Ua TRACE (A) NEGATIVE  Urinalysis, Microscopic (reflex)     Status: Abnormal    Collection Time: 09/07/22 10:45 AM  Result Value Ref Range   RBC / HPF 0-5 0 - 5 RBC/hpf   WBC, UA 0-5 0 - 5 WBC/hpf   Bacteria, UA FEW (A) NONE SEEN   Squamous Epithelial / LPF 6-10 0 - 5  CBC     Status: Abnormal   Collection Time: 09/07/22 11:17 AM  Result Value Ref Range   WBC 6.1 4.0 - 10.5 K/uL   RBC 3.88 3.87 - 5.11 MIL/uL   Hemoglobin 11.7 (L) 12.0 - 15.0 g/dL   HCT 35.2 (L) 36.0 - 46.0 %   MCV 90.7 80.0 - 100.0 fL   MCH 30.2 26.0 - 34.0 pg   MCHC 33.2 30.0 - 36.0 g/dL   RDW 13.2 11.5 - 15.5 %   Platelets 217 150 - 400 K/uL   nRBC 0.0 0.0 - 0.2 %  hCG, quantitative, pregnancy     Status: Abnormal   Collection Time: 09/07/22 11:17 AM  Result Value Ref Range   hCG, Beta Chain, Quant, S 7,789 (H) <5 mIU/mL   Assessment and Plan  --36 y.o. [id number] at [redacted]w[redacted]d --Recurrent spotting in early pregnancy --+ GS on ultrasound from 09/01/2022 --Reassuring rise in quant hCG, Hgb from previous assessment --Repeat ultrasound not indicated today --Blood type O POS --Discharge home in stable condition  F/U: Patient to keep her current appointment for viability ultrasound on 09/16/2022  SDarlina Rumpf MSA, MSN, CNM 09/07/2022, 1:16 PM

## 2022-09-15 ENCOUNTER — Inpatient Hospital Stay (HOSPITAL_COMMUNITY)
Admission: AD | Admit: 2022-09-15 | Discharge: 2022-09-15 | Disposition: A | Discharge status: Home / Self Care | Payer: Medicaid Other | Attending: Obstetrics and Gynecology | Admitting: Obstetrics and Gynecology

## 2022-09-15 ENCOUNTER — Other Ambulatory Visit: Payer: Self-pay

## 2022-09-15 DIAGNOSIS — Z711 Person with feared health complaint in whom no diagnosis is made: Secondary | ICD-10-CM

## 2022-09-15 DIAGNOSIS — Z3A08 8 weeks gestation of pregnancy: Secondary | ICD-10-CM | POA: Insufficient documentation

## 2022-09-15 DIAGNOSIS — N939 Abnormal uterine and vaginal bleeding, unspecified: Secondary | ICD-10-CM

## 2022-09-15 DIAGNOSIS — O26851 Spotting complicating pregnancy, first trimester: Secondary | ICD-10-CM | POA: Insufficient documentation

## 2022-09-15 NOTE — MAU Provider Note (Signed)
Event Date/Time   First Provider Initiated Contact with Patient 09/15/22 1659      S Ms. MIKHIA DUSEK is a 36 y.o. [id number] patient who presents to MAU today with complaint of on going spotting for 3 week since being notified of pregnancy. Patient denies bright red vaginal bleeding, and passing clots. Patient endorses wearing a panty liner but denies saturating one."Sees pink/brown blood occasionally when wiping" She denies pain of any kind. Denies recent intercourse.   O BP (!) 99/55 (BP Location: Right Arm)   Pulse 92   Temp 98.5 F (36.9 C) (Oral)   Resp 16   Ht '5\' 8"'$  (1.727 m)   Wt 57 kg   LMP 07/20/2022 (Exact Date)   SpO2 99% Comment: room air  BMI 19.11 kg/m  Physical Exam Vitals and nursing note reviewed.  Constitutional:      General: She is not in acute distress.    Appearance: Normal appearance.  HENT:     Head: Normocephalic.  Pulmonary:     Effort: Pulmonary effort is normal.  Musculoskeletal:     Cervical back: Normal range of motion.  Skin:    General: Skin is warm and dry.     Capillary Refill: Capillary refill takes less than 2 seconds.  Neurological:     Mental Status: She is alert and oriented to person, place, and time.  Psychiatric:        Mood and Affect: Mood normal.    A Medical screening exam complete  P 1. Physically well but worried   2. Vaginal spotting   3. [redacted] weeks gestation of pregnancy    - Discussed brown blood is more indicative of old blood and not fresh. Also discussed cervical friability in early pregnancy. Patient reassured by discussion.  - Patient has a [place] scheduled for 09/23/22. Encouraged to keep appt.  - Discharge from MAU in stable condition - Warning signs for worsening condition that would warrant emergency follow-up discussed - Patient discharged in stable condition and Patient may return to MAU as needed   Deloris Ping, [place] 09/15/2022 5:26 PM

## 2022-09-15 NOTE — MAU Note (Signed)
Laurie Friedman is a 36 y.o. at 36w1dhere in MAU reporting: ongoing bleeding since first MAU visit. States no one is telling her why she is bleeding. States bleeding is still like spotting. Intermittent back pains.  Onset of complaint: ongoing  Pain score: 0/10  Vitals:   09/15/22 1633  BP: (!) 99/55  Pulse: 92  Resp: 16  Temp: 98.5 F (36.9 C)  SpO2: 99%     FHT:NA  Lab orders placed from triage: none

## 2022-09-16 ENCOUNTER — Other Ambulatory Visit: Payer: Medicaid Other

## 2022-09-23 ENCOUNTER — Other Ambulatory Visit: Payer: Self-pay

## 2022-09-23 ENCOUNTER — Other Ambulatory Visit (INDEPENDENT_AMBULATORY_CARE_PROVIDER_SITE_OTHER): Payer: Medicaid Other

## 2022-09-23 ENCOUNTER — Ambulatory Visit (INDEPENDENT_AMBULATORY_CARE_PROVIDER_SITE_OTHER): Payer: Medicaid Other | Admitting: Advanced Practice Midwife

## 2022-09-23 DIAGNOSIS — Z3491 Encounter for supervision of normal pregnancy, unspecified, first trimester: Secondary | ICD-10-CM | POA: Diagnosis not present

## 2022-09-23 DIAGNOSIS — O3680X0 Pregnancy with inconclusive fetal viability, not applicable or unspecified: Secondary | ICD-10-CM | POA: Diagnosis not present

## 2022-09-23 DIAGNOSIS — D249 Benign neoplasm of unspecified breast: Secondary | ICD-10-CM

## 2022-09-23 DIAGNOSIS — O26841 Uterine size-date discrepancy, first trimester: Secondary | ICD-10-CM | POA: Insufficient documentation

## 2022-09-23 DIAGNOSIS — Z3A08 8 weeks gestation of pregnancy: Secondary | ICD-10-CM

## 2022-09-23 HISTORY — DX: Benign neoplasm of unspecified breast: D24.9

## 2022-09-23 NOTE — Progress Notes (Addendum)
Ultrasounds Results Note  SUBJECTIVE HPI:  [name] is a 36 y.o. [id number] at 47w2dby LMP who [facility]. The patient denies abdominal pain or vaginal bleeding.  Upon review of the patient's records, patient was first seen in MAU on 09/01/22 for vaginal bleeding and abd pain.     Previous HCG's  Latest Reference Range & Units 09/01/22 16:32 09/07/22 11:17  HCG, Beta Chain, Quant, S <5 mIU/mL 1,209 (H) 7,789 (H)  (H): Data is abnormally high  Previous UKoreaUKoreaOB LESS THAN 14 WEEKS WITH OB TRANSVAGINAL  Result Date: 09/01/2022 CLINICAL DATA:  [id number] bleeding affecting early pregnancy [id number] Gestational age by last menstrual period 6 weeks and 1 day. Estimated due date by last menstrual period 04/26/2023. Last menstrual 07/20/2022. EXAM: OBSTETRIC <14 WK UKoreaAND TRANSVAGINAL OB UKoreaTECHNIQUE: Both transabdominal and transvaginal ultrasound examinations were performed for complete evaluation of the gestation as well as the maternal uterus, adnexal regions, and pelvic cul-de-sac. Transvaginal technique was performed to assess early pregnancy. COMPARISON:  None Available. FINDINGS: Intrauterine gestational sac: Single with unclear possibility of a second gestational sac. Yolk sac:  Not Visualized. Embryo:  Not Visualized. Cardiac Activity: Not Visualized. MSD: 3.5 mm   5 w   1 d Subchorionic hemorrhage:  None visualized. Maternal uterus/adnexae: Bilateral ovaries are unremarkable. Question corpus luteum cyst within bilateral ovaries. IMPRESSION: Probable early intrauterine gestational sac (unclear possibility of a second gestational sac/twin gestation), but no yolk sac, fetal pole, or cardiac activity yet visualized. Recommend follow-up quantitative B-HCG levels and follow-up UKoreain 14 days to assess viability. This recommendation follows SRU consensus guidelines: Diagnostic Criteria for Nonviable Pregnancy Early in the First Trimester.  NAlta CorningMed 2013;; [id number] Electronically Signed   By: MIven FinnM.D.   On: 09/01/2022 17:05    --/--/O POS (10/23 1632)  Repeat ultrasound was performed earlier today.   Past Medical History:  Diagnosis Date   Anemia    Anxiety    Fibroadenoma of breast    UTI (urinary tract infection)    Past Surgical History:  Procedure Laterality Date   BREAST LUMPECTOMY     x2   BREAST LUMPECTOMY Left 05/01/2022   Procedure: LEFT BREAST LUMPECTOMY;  Surgeon: CErroll Luna MD;  [facility]  Service: General;  Laterality: Left;   CESAREAN SECTION     Social History   Socioeconomic History   Marital status: Single    Spouse name: Not on file   Number of children: Not on file   Years of education: Not on file   Highest education level: Not on file  Occupational History   Not on file  Tobacco Use   Smoking status: Every Day    Packs/day: 1.00    Years: 8.00    Total pack years: 8.00    Types: Cigarettes   Smokeless tobacco: Never  Vaping Use   Vaping Use: Never used  Substance and Sexual Activity   Alcohol use: No   Drug use: No   Sexual activity: Yes  Other Topics Concern   Not on file  Social History Narrative   Not on file   Social Determinants of Health   Financial Resource Strain: Not on file  Food Insecurity: Not on file  Transportation Needs: Not on file  Physical Activity: Not on file  Stress: Not on file  Social Connections: Not on file  Intimate Partner Violence: Not  on file   Current Outpatient Medications on File Prior to Visit  Medication Sig Dispense Refill   fluticasone (FLONASE) 50 MCG/ACT nasal spray Place 1 spray into both nostrils daily. 16 g 0   No current facility-administered medications on file prior to visit.   No Known Allergies  I have reviewed patient's Past Medical Hx, Surgical Hx, Family Hx, Social Hx, medications and allergies.   Review of Systems Review of Systems  Constitutional: Negative for  fever and chills.  Gastrointestinal: Negative for abdominal pain.  Genitourinary: Negative for vaginal bleeding.  Musculoskeletal: Negative for back pain.  Neurological: Negative for dizziness and weakness.    Physical Exam  LMP 07/20/2022 (Exact Date)   Patient's last menstrual period was 07/20/2022 (exact date). GENERAL: Well-developed, well-nourished female in no acute distress.  HEENT: Normocephalic, atraumatic.   LUNGS: Effort normal ABDOMEN: Deferred HEART: Regular rate  SKIN: Warm, dry and without erythema PSYCH: Normal mood and affect NEURO: Alert and oriented x 4  LAB RESULTS No results found for this or any previous visit (from the past 24 hour(s)).  IMAGING 8.0 week live SIUP FHR 157  ASSESSMENT 1. Pregnancy with inconclusive fetal viability, single or unspecified fetus   2. [redacted] weeks gestation of pregnancy   3. Size of fetus inconsistent with dates in first trimester   4. Normal IUP (intrauterine pregnancy) on prenatal ultrasound, first trimester     PLAN Start prenatal care with provider of your choice List of Ob/Gyn providers given. Patient advised to start/continue taking prenatal vitamins Go to MAU as needed for heavy bleeding, abdominal pain or fever greater than 100.4.  [place], [place] 09/23/2022 8:37 AM

## 2022-11-06 ENCOUNTER — Ambulatory Visit (INDEPENDENT_AMBULATORY_CARE_PROVIDER_SITE_OTHER): Payer: Medicaid Other

## 2022-11-06 DIAGNOSIS — Z3480 Encounter for supervision of other normal pregnancy, unspecified trimester: Secondary | ICD-10-CM

## 2022-11-06 DIAGNOSIS — Z1332 Encounter for screening for maternal depression: Secondary | ICD-10-CM | POA: Diagnosis not present

## 2022-11-06 MED ORDER — BLOOD PRESSURE KIT DEVI: 1.0000 | 0 refills | Status: AC

## 2022-11-06 MED ORDER — VITAFOL ULTRA 29-0.6-0.4-200 MG PO CAPS: 1.0000 | ORAL_CAPSULE | Freq: Every day | ORAL | 11 refills | Status: AC

## 2022-11-06 NOTE — Progress Notes (Signed)
New OB Intake  I connected with Laurie Friedman on 11/06/22 at  8:15 AM EST by telephone Video Visit and verified that I am speaking with the correct person using two identifiers. Nurse is located at [facility] and pt is located at Home.  I discussed the limitations, risks, security and privacy concerns of performing an evaluation and management service by telephone and the availability of in person appointments. I also discussed with the patient that there may be a patient responsible charge related to this service. The patient expressed understanding and agreed to proceed.  I explained I am completing New OB Intake today. We discussed EDD of 05/05/23 that is based on first trimester u/s. Pt is G7/P5015. I reviewed her allergies, medications, Medical/Surgical/OB history, and appropriate screenings. I informed her of [facility] services. [facility] information placed in AVS. Based on history, this is a low risk pregnancy.  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 Patient given information for [facility] Healthy Baby website for more information about Women's and [place]. Patient is not interested in water birth. Offered upcoming OB visit with CNM to discuss further.  MyChart/Babyscripts MyChart access verified. I explained pt will have some visits in office and some virtually. Babyscripts instructions given and order placed. Patient verifies receipt of registration text/e-mail. Account successfully created and app downloaded.  Blood Pressure Cuff/Weight Scale Blood pressure cuff ordered for patient to pick-up from [employer]. Explained after first prenatal appt pt will check weekly and document in 48. Patient does have weight scale.  Anatomy US Explained first scheduled Korea will be around 19 weeks.  Anatomy US Ordered. Date TBD.   Labs Discussed Johnsie Cancel genetic screening with patient. Would like both Panorama and Horizon drawn at new OB visit. Routine prenatal labs needed.  COVID Vaccine Patient has not had COVID vaccine.   Is patient a CenteringPregnancy candidate?  Declined Declined due to Group setting Not a candidate due to  If accepted,   Social Determinants of Health Food Insecurity: Patient denies food insecurity. WIC Referral: Patient is interested in referral to [facility].  Transportation: Patient denies transportation needs. Childcare: Discussed no children allowed at ultrasound appointments. Offered childcare services; patient declines childcare services at this time.  First visit review I reviewed new OB appt with patient. I explained they will have a provider visit that includes prenatal labs, pap smear, std screening, genetic screening, and discuss plan of care for pregnancy. Explained pt will be seen by Baltazar Najjar at first visit; encounter routed to appropriate provider. Explained that patient will be seen by pregnancy navigator following visit with provider.   Lucianne Lei, RN 11/06/2022  8:20 AM

## 2022-11-09 ENCOUNTER — Inpatient Hospital Stay (HOSPITAL_COMMUNITY)
Admission: AD | Admit: 2022-11-09 | Discharge: 2022-11-09 | Disposition: A | Discharge status: Home / Self Care | Payer: Medicaid Other | Attending: Obstetrics and Gynecology | Admitting: Obstetrics and Gynecology

## 2022-11-09 DIAGNOSIS — O26892 Other specified pregnancy related conditions, second trimester: Secondary | ICD-10-CM | POA: Insufficient documentation

## 2022-11-09 DIAGNOSIS — Z3A14 14 weeks gestation of pregnancy: Secondary | ICD-10-CM | POA: Diagnosis not present

## 2022-11-09 DIAGNOSIS — R109 Unspecified abdominal pain: Secondary | ICD-10-CM

## 2022-11-09 DIAGNOSIS — M7918 Myalgia, other site: Secondary | ICD-10-CM | POA: Diagnosis not present

## 2022-11-09 DIAGNOSIS — Z3492 Encounter for supervision of normal pregnancy, unspecified, second trimester: Secondary | ICD-10-CM

## 2022-11-09 LAB — URINALYSIS, ROUTINE W REFLEX MICROSCOPIC
Bilirubin Urine: NEGATIVE
Glucose, UA: NEGATIVE mg/dL
Hgb urine dipstick: NEGATIVE
Ketones, ur: NEGATIVE mg/dL
Nitrite: NEGATIVE
Protein, ur: NEGATIVE mg/dL
Specific Gravity, Urine: 1.019 (ref 1.005–1.030)
pH: 5 (ref 5.0–8.0)

## 2022-11-09 MED ORDER — ACETAMINOPHEN 325 MG PO TABS: 650.0000 mg | ORAL_TABLET | ORAL | 0 refills | Status: AC | PRN

## 2022-11-09 MED ORDER — ACETAMINOPHEN 500 MG PO TABS
1000.0000 mg | ORAL_TABLET | Freq: Once | ORAL | Status: AC
  Administered 2022-11-09: 1000 mg via ORAL
  Filled 2022-11-09: qty 2

## 2022-11-09 NOTE — Discharge Instructions (Signed)

## 2022-11-09 NOTE — MAU Provider Note (Signed)
History     CSN: [id number]  Arrival date and time: 11/09/22 5003   Event Date/Time   First Provider Initiated Contact with Patient 11/09/22 1902      Chief Complaint  Patient presents with   Abdominal Pain   HPI Laurie Friedman is a 36 y.o. [id number] at 58w5dwho presents to MAU with chief complaint of abdominal pain, radiating superiorly from her cesarean section scar towards her umbilicus. Patient offers "I think I might need to start wearing bigger jeans". Pain score is 8/10. She denies aggravating or alleviating factors. She has not taken medication for this complaint. She denies abdominal tenderness, vaginal bleeding, dysuria, fever or recent illness.  Patient receives care with [facility].  OB History     Gravida  7   Para  5   Term  5   Preterm      AB  1   Living  5      SAB  1   IAB      Ectopic      Multiple      Live Births  5        Obstetric Comments  Pt was pushing, fh was dropping- ended up with c/s 2013 baby was face up, had problems getting the shoulders out, was bleeding, got a shot         Past Medical History:  Diagnosis Date   Anemia    Anxiety    Fibroadenoma of breast    UTI (urinary tract infection)     Past Surgical History:  Procedure Laterality Date   BREAST LUMPECTOMY Left 2010   BREAST LUMPECTOMY Left 05/01/2022   Procedure: LEFT BREAST LUMPECTOMY;  Surgeon: CErroll Luna MD;  [facility]  Service: General;  Laterality: Left;   BREAST LUMPECTOMY Bilateral 2014   CESAREAN SECTION  2008    Family History  Problem Relation Age of Onset   Healthy Mother    Healthy Father    Diabetes Maternal Grandmother    Breast cancer Neg Hx     Social History   Tobacco Use   Smoking status: Former    Packs/day: 1.00    Years: 8.00    Total pack years: 8.00    Types: Cigarettes    Quit date: 09/2022    Years since quitting: 0.1   Smokeless tobacco: Never  Vaping Use   Vaping Use: Never  used  Substance Use Topics   Alcohol use: No   Drug use: No    Allergies: No Known Allergies  Medications Prior to Admission  Medication Sig Dispense Refill Last Dose   Blood Pressure Monitoring (BLOOD PRESSURE KIT) DEVI 1 kit by Does not apply route once a week. 1 each 0    fluticasone (FLONASE) 50 MCG/ACT nasal spray Place 1 spray into both nostrils daily. (Patient not taking: Reported on 11/06/2022) 16 g 0    Prenat-Fe Poly-Methfol-FA-DHA (VITAFOL ULTRA) 29-0.6-0.4-200 MG CAPS Take 1 capsule by mouth daily. 30 capsule 11     Review of Systems  Gastrointestinal:  Positive for abdominal pain.  All other systems reviewed and are negative.  Physical Exam   Blood pressure 111/64, pulse 89, temperature 98.2 F (36.8 C), temperature source Oral, resp. rate 14, last menstrual period 07/20/2022, SpO2 100 %.  Physical Exam Vitals and nursing note reviewed. Exam conducted with a chaperone present.  Constitutional:      General: She is not in acute distress.    Appearance: She is  well-developed. She is not ill-appearing.  Cardiovascular:     Rate and Rhythm: Normal rate.  Pulmonary:     Effort: Pulmonary effort is normal.  Abdominal:     Palpations: Abdomen is soft.     Tenderness: There is no abdominal tenderness.  Skin:    Capillary Refill: Capillary refill takes less than 2 seconds.  Neurological:     Mental Status: She is alert and oriented to person, place, and time.     MAU Course  Procedures  MDM Orders Placed This Encounter  Procedures   Urinalysis, Routine w reflex microscopic Urine, Clean Catch   Discharge patient   Patient Vitals for the past 24 hrs:  BP Temp Temp src Pulse Resp SpO2  11/09/22 1850 111/64 98.2 F (36.8 C) Oral 89 14 100 %   Results for orders placed or performed during the hospital encounter of 11/09/22 (from the past 24 hour(s))  Urinalysis, Routine w reflex microscopic Urine, Clean Catch     Status: Abnormal   Collection Time: 11/09/22   6:55 PM  Result Value Ref Range   Color, Urine YELLOW YELLOW   APPearance CLOUDY (A) CLEAR   Specific Gravity, Urine 1.019 1.005 - 1.030   pH 5.0 5.0 - 8.0   Glucose, UA NEGATIVE NEGATIVE mg/dL   Hgb urine dipstick NEGATIVE NEGATIVE   Bilirubin Urine NEGATIVE NEGATIVE   Ketones, ur NEGATIVE NEGATIVE mg/dL   Protein, ur NEGATIVE NEGATIVE mg/dL   Nitrite NEGATIVE NEGATIVE   Leukocytes,Ua TRACE (A) NEGATIVE   RBC / HPF 0-5 0 - 5 RBC/hpf   WBC, UA 0-5 0 - 5 WBC/hpf   Bacteria, UA RARE (A) NONE SEEN   Squamous Epithelial / LPF 21-50 0 - 5 /HPF   Mucus PRESENT    Meds ordered this encounter  Medications   acetaminophen (TYLENOL) tablet 1,000 mg   acetaminophen (TYLENOL) 325 MG tablet    Sig: Take 2 tablets (650 mg total) by mouth every 4 (four) hours as needed.    Dispense:  180 tablet    Refill:  0    Order Specific Question:   Supervising Provider    Answer:   Griffin Basil [[id number]]     Assessment and Plan  --36 y.o. [id number] at [redacted]w[redacted]d --Musculoskeletal pain --FHT and active movement observed on bedside ultrasound --Pain improved with Tylenol, continue PRN --Discharge home in stable condition with second trimester precautions  SDarlina Friedman MBotkins MSN, CNM 11/09/2022, 8:32 PM

## 2022-11-09 NOTE — MAU Note (Signed)
Pt reports to mau with c/o sharp constant pain at her c section incision since Friday.  Pt denies any vag bleeding or irreg dc.

## 2022-11-10 NOTE — L&D Delivery Note (Signed)
Delivery Note 37 y.o. [id number] at [redacted]w[redacted]d admitted for IOL due to FGR.   At 6:25 AM a viable female was delivered via Vaginal, Spontaneous (Presentation: Left Occiput Anterior).  APGAR: 9, 9; weight TBD .   Placenta status: Spontaneous, Intact.  Cord: 3 vessels with the following complications: None.  Cord pH: not collected.  Anesthesia: Epidural Episiotomy: None Lacerations: None Suture Repair:  none Est. Blood Loss (mL): 63  Mom to postpartum.  Baby to Couplet care / Skin to Skin.  Sheppard Evens MD MPH OB Fellow, Faculty Practice [facility], [facility] for [facility] 04/25/2023

## 2022-11-18 ENCOUNTER — Other Ambulatory Visit (HOSPITAL_COMMUNITY)
Admission: RE | Admit: 2022-11-18 | Discharge: 2022-11-18 | Disposition: A | Discharge status: Home / Self Care | Payer: Medicaid Other | Source: Ambulatory Visit | Attending: Obstetrics | Admitting: Obstetrics

## 2022-11-18 ENCOUNTER — Encounter: Payer: Self-pay | Admitting: Obstetrics

## 2022-11-18 ENCOUNTER — Ambulatory Visit (INDEPENDENT_AMBULATORY_CARE_PROVIDER_SITE_OTHER): Payer: Medicaid Other | Admitting: Obstetrics

## 2022-11-18 VITALS — BP 98/64 | HR 78 | Wt 133.0 lb

## 2022-11-18 DIAGNOSIS — Z3480 Encounter for supervision of other normal pregnancy, unspecified trimester: Secondary | ICD-10-CM

## 2022-11-18 DIAGNOSIS — O34219 Maternal care for unspecified type scar from previous cesarean delivery: Secondary | ICD-10-CM

## 2022-11-18 DIAGNOSIS — Z3482 Encounter for supervision of other normal pregnancy, second trimester: Secondary | ICD-10-CM

## 2022-11-18 DIAGNOSIS — Z3A16 16 weeks gestation of pregnancy: Secondary | ICD-10-CM | POA: Diagnosis not present

## 2022-11-18 DIAGNOSIS — Z98891 History of uterine scar from previous surgery: Secondary | ICD-10-CM

## 2022-11-18 NOTE — Progress Notes (Signed)
Subjective:    Laurie Friedman is being seen today for her first obstetrical visit.  This is not a planned pregnancy. She is at 45w0dgestation. Her obstetrical history is significant for  none . Relationship with FOB: significant other, living together. Patient does intend to breast feed. Pregnancy history fully reviewed.  The information documented in the HPI was reviewed and verified.  Menstrual History: OB History     Gravida  7   Para  5   Term  5   Preterm      AB  1   Living  5      SAB  1   IAB      Ectopic      Multiple      Live Births  5        Obstetric Comments  Pt was pushing, fh was dropping- ended up with c/s 2013 baby was face up, had problems getting the shoulders out, was bleeding, got a shot          Patient's last menstrual period was 07/20/2022 (approximate).    Past Medical History:  Diagnosis Date   Anemia    Anxiety    Fibroadenoma of breast    UTI (urinary tract infection)     Past Surgical History:  Procedure Laterality Date   BREAST LUMPECTOMY Left 2010   BREAST LUMPECTOMY Left 05/01/2022   Procedure: LEFT BREAST LUMPECTOMY;  Surgeon: CErroll Luna MD;  [place] [facility]  Service: General;  Laterality: Left;   BREAST LUMPECTOMY Bilateral 2014   CESAREAN SECTION  2008    (Not in a hospital admission)  No Known Allergies  Social History   Tobacco Use   Smoking status: Former    Packs/day: 1.00    Years: 8.00    Total pack years: 8.00    Types: Cigarettes    Quit date: 09/2022    Years since quitting: 0.1   Smokeless tobacco: Never  Substance Use Topics   Alcohol use: No    Family History  Problem Relation Age of Onset   Healthy Mother    Healthy Father    Diabetes Maternal Grandmother    Breast cancer Neg Hx      Review of Systems Constitutional: negative for weight loss Gastrointestinal: negative for vomiting Genitourinary:negative for genital lesions and vaginal discharge and  dysuria Musculoskeletal:negative for back pain Behavioral/Psych: negative for abusive relationship, depression, illegal drug usage and tobacco use    Objective:    BP 98/64   Pulse 78   Wt 133 lb (60.3 kg)   LMP 07/20/2022 (Approximate)   BMI 20.22 kg/m  General Appearance:    Alert, cooperative, no distress, appears stated age  Head:    Normocephalic, without obvious abnormality, atraumatic  Eyes:    PERRL, conjunctiva/corneas clear, EOM's intact, fundi    benign, both eyes  Ears:    Normal TM's and external ear canals, both ears  Nose:   Nares normal, septum midline, mucosa normal, no drainage    or sinus tenderness  Throat:   Lips, mucosa, and tongue normal; teeth and gums normal  Neck:   Supple, symmetrical, trachea midline, no adenopathy;    thyroid:  no enlargement/tenderness/nodules; no carotid   bruit or JVD  Back:     Symmetric, no curvature, ROM normal, no CVA tenderness  Lungs:     Clear to auscultation bilaterally, respirations unlabored  Chest Wall:    No tenderness or deformity   Heart:  Regular rate and rhythm, S1 and S2 normal, no murmur, rub   or gallop  Breast Exam:    No tenderness, masses, or nipple abnormality  Abdomen:     Soft, non-tender, bowel sounds active all four quadrants,    no masses, no organomegaly  Genitalia:    Normal female without lesion, discharge or tenderness  Extremities:   Extremities normal, atraumatic, no cyanosis or edema  Pulses:   2+ and symmetric all extremities  Skin:   Skin color, texture, turgor normal, no rashes or lesions  Lymph nodes:   Cervical, supraclavicular, and axillary nodes normal  Neurologic:   CNII-XII intact, normal strength, sensation and reflexes    throughout      Lab Review Urine pregnancy test Labs reviewed yes Radiologic studies reviewed no  Assessment:    Pregnancy at 70w0dweeks    Plan:    1. Supervision of other normal pregnancy, antepartum Rx: - CBC/D/Plt+RPR+Rh+ABO+RubIgG... - Culture,  OB Urine - Cervicovaginal ancillary only( [facility]) - Cytology - PAP( [facility]) - PANORAMA PRENATAL TEST FULL PANEL - [facility] [employer] - HgB A1c - AFP, Serum, Open Spina Bifida  2. History of cesarean section  3. Hx successful VBAC (vaginal birth after cesarean), currently pregnant - desires VBAC     Prenatal vitamins.  Counseling provided regarding continued use of seat belts, cessation of alcohol consumption, smoking or use of illicit drugs; infection precautions i.e., influenza/TDAP immunizations, toxoplasmosis,CMV, parvovirus, listeria and varicella; workplace safety, exercise during pregnancy; routine dental care, safe medications, sexual activity, hot tubs, saunas, pools, travel, caffeine use, fish and methlymercury, potential toxins, hair treatments, varicose veins Weight gain recommendations per IOM guidelines reviewed: underweight/BMI< 18.5--> gain 28 - 40 lbs; normal weight/BMI 18.5 - 24.9--> gain 25 - 35 lbs; overweight/BMI 25 - 29.9--> gain 15 - 25 lbs; obese/BMI >30->gain  11 - 20 lbs Problem list reviewed and updated. FIRST/CF mutation testing/NIPT/QUAD SCREEN/fragile X/Ashkenazi Jewish population testing/Spinal muscular atrophy discussed: requested. Role of ultrasound in pregnancy discussed; fetal survey: requested. Amniocentesis discussed: not indicated.  Orders Placed This Encounter  Procedures   Culture, OB Urine   CBC/D/Plt+RPR+Rh+ABO+RubIgG...   [facility]    ==========Department Information========== ID: [id number]:[facility]Dept: [phone] Fax: [phone]    Order Specific Question:   Expected due date (MM/DD/YYYY):    Answer:   05/05/2023    Order Specific Question:   Is this a twin pregnancy? (viable, no vanished twin)    Answer:   No    Order Specific Question:   Is this a surrogate or  egg donor pregnancy?    Answer:   No    Order Specific Question:   I want fetal sex included in the report:    Answer:   Yes    Order Specific Question:   Maternal Weight (lbs):    Answer:   125    Order Specific Question:   Which Microdeletion Panel should be ordered?    Answer:   22q11.2 Deletion    Order Specific Question:   What type of billing?    Answer:   BCIT GroupSpecific Question:   By placing this electronic order I confirm the testing ordered herein is medically necessary and this patient has been informed of the details of the  genetic test(s) ordered, including the risks, benefits, and alternatives, and has consented to testing.    Answer:   Yes    Order Specific Question:   Select an order diagnosis: For additional options refer to [email or web address]    Answer:   Encounter for supervision of other normal pregnancy in second trimester [[id number]]   [facility]    ==========Department Information========== ID: [id number] [address], [place] [address] Dept: [phone] Dept Fax: [phone]     Order Specific Question:   Specify the name or ID of a valid [employer] Custom Panel:    Answer:   HBASIC    Order Specific Question:   Is patient pregnant?    Answer:   Yes    Order Specific Question:   Practice ensures that HIPAA consent is obtained and will make available to [facility] upon request?    Answer:   Yes    Order Specific Question:   By placing this electronic order I confirm the testing ordered herein is medically necessary and this patient has been informed of the details of the genetic test(s) ordered, including the risks, benefits, and alternatives, and has consented to testing.    Answer:   Yes    Order Specific Question:   What type of billing?    Answer:   Programme researcher, broadcasting/film/video Question:   Select  an order diagnosis: For additional options refer to [email or web address]    Answer:   Encounter for supervision of other normal pregnancy in second trimester [[id number]]    Order Specific Question:   Tay-Sachs add-on test?    Answer:   No   HgB A1c   AFP, Serum, Open Spina Bifida    Order Specific Question:   Is patient insulin dependent?    Answer:   No    Order Specific Question:   Gestational Age (GA), weeks    Answer:   62    Order Specific Question:   Date on which patient was at this [facility]    Answer:   11/18/2022    Order Specific Question:   GA Calculation Method    Answer:   LMP    Order Specific Question:   Number of fetuses    Answer:   1    Follow up in 4 weeks.  I have spent a total of 20 minutes of face-to-face time, excluding clinical staff time, reviewing notes and preparing to see patient, ordering tests and/or medications, and counseling the patient.    Mosella Kasa A. Jodi Mourning MD 11/18/2022

## 2022-11-18 NOTE — Progress Notes (Signed)
NOB, c/o headaches everyday 6-7/10  x 2 weeks.

## 2022-11-19 ENCOUNTER — Other Ambulatory Visit: Payer: Self-pay | Admitting: Obstetrics

## 2022-11-19 DIAGNOSIS — B9689 Other specified bacterial agents as the cause of diseases classified elsewhere: Secondary | ICD-10-CM

## 2022-11-19 LAB — CBC/D/PLT+RPR+RH+ABO+RUBIGG...
Antibody Screen: NEGATIVE
Basophils Absolute: 0 10*3/uL (ref 0.0–0.2)
Basos: 0 %
EOS (ABSOLUTE): 0.2 10*3/uL (ref 0.0–0.4)
Eos: 2 %
HCV Ab: NONREACTIVE
HIV Screen 4th Generation wRfx: NONREACTIVE
Hematocrit: 34.9 % (ref 34.0–46.6)
Hemoglobin: 11.8 g/dL (ref 11.1–15.9)
Hepatitis B Surface Ag: NEGATIVE
Immature Grans (Abs): 0 10*3/uL (ref 0.0–0.1)
Immature Granulocytes: 1 %
Lymphocytes Absolute: 1.3 10*3/uL (ref 0.7–3.1)
Lymphs: 17 %
MCH: 30.9 pg (ref 26.6–33.0)
MCHC: 33.8 g/dL (ref 31.5–35.7)
MCV: 91 fL (ref 79–97)
Monocytes Absolute: 0.6 10*3/uL (ref 0.1–0.9)
Monocytes: 8 %
Neutrophils Absolute: 5.8 10*3/uL (ref 1.4–7.0)
Neutrophils: 72 %
Platelets: 240 10*3/uL (ref 150–450)
RBC: 3.82 x10E6/uL (ref 3.77–5.28)
RDW: 14.4 % (ref 11.7–15.4)
RPR Ser Ql: NONREACTIVE
Rh Factor: POSITIVE
Rubella Antibodies, IGG: 4.21 index (ref >0.99)
WBC: 8 10*3/uL (ref 3.4–10.8)

## 2022-11-19 LAB — HCV INTERPRETATION

## 2022-11-19 LAB — CERVICOVAGINAL ANCILLARY ONLY
Bacterial Vaginitis (gardnerella): POSITIVE — AB
Candida Glabrata: NEGATIVE
Candida Vaginitis: NEGATIVE
Chlamydia: NEGATIVE
Comment: NEGATIVE
Comment: NEGATIVE
Comment: NEGATIVE
Comment: NEGATIVE
Comment: NEGATIVE
Comment: NORMAL
Neisseria Gonorrhea: NEGATIVE
Trichomonas: NEGATIVE

## 2022-11-19 LAB — HEMOGLOBIN A1C
Est. average glucose Bld gHb Est-mCnc: 111 mg/dL
Hgb A1c MFr Bld: 5.5 % (ref 4.8–5.6)

## 2022-11-19 MED ORDER — METRONIDAZOLE 500 MG PO TABS: 500.0000 mg | ORAL_TABLET | Freq: Two times a day (BID) | ORAL | 2 refills | Status: DC

## 2022-11-20 ENCOUNTER — Telehealth: Payer: Self-pay

## 2022-11-20 LAB — AFP, SERUM, OPEN SPINA BIFIDA
AFP MoM: 1.18
AFP Value: 47.7 ng/mL
Gest. Age on Collection Date: 16 weeks
Maternal Age At EDD: 36 yr
OSBR Risk 1 IN: 10000
Test Results:: NEGATIVE
Weight: 133 [lb_av]

## 2022-11-20 LAB — CYTOLOGY - PAP
Comment: NEGATIVE
Diagnosis: NEGATIVE
High risk HPV: NEGATIVE

## 2022-11-20 LAB — URINE CULTURE, OB REFLEX

## 2022-11-20 LAB — CULTURE, OB URINE

## 2022-11-20 NOTE — Telephone Encounter (Signed)
S/w pt and advised of results and that rx was sent

## 2022-11-28 LAB — PANORAMA PRENATAL TEST FULL PANEL:PANORAMA TEST PLUS 5 ADDITIONAL MICRODELETIONS

## 2022-11-28 LAB — HORIZON CUSTOM: REPORT SUMMARY: NEGATIVE

## 2022-12-04 ENCOUNTER — Ambulatory Visit (INDEPENDENT_AMBULATORY_CARE_PROVIDER_SITE_OTHER): Payer: Medicaid Other | Admitting: Obstetrics

## 2022-12-04 VITALS — BP 95/59 | HR 83 | Wt 135.0 lb

## 2022-12-04 DIAGNOSIS — Z3482 Encounter for supervision of other normal pregnancy, second trimester: Secondary | ICD-10-CM

## 2022-12-04 DIAGNOSIS — B379 Candidiasis, unspecified: Secondary | ICD-10-CM

## 2022-12-04 DIAGNOSIS — Z3480 Encounter for supervision of other normal pregnancy, unspecified trimester: Secondary | ICD-10-CM

## 2022-12-04 DIAGNOSIS — Z3A18 18 weeks gestation of pregnancy: Secondary | ICD-10-CM

## 2022-12-04 MED ORDER — TERCONAZOLE 0.8 % VA CREA: 1.0000 | TOPICAL_CREAM | Freq: Every day | VAGINAL | 0 refills | Status: DC

## 2022-12-04 NOTE — Progress Notes (Signed)
Subjective:  Laurie Friedman is a 37 y.o. [id number] at 25w2dbeing seen today for ongoing prenatal care.  She is currently monitored for the following issues for this low-risk pregnancy and has Benign tumor of breast; Size of fetus inconsistent with dates in first trimester; and Supervision of other normal pregnancy, antepartum on their problem list.  Patient reports vaginal irritation.  Contractions: Not present. Vag. Bleeding: None.   . Denies leaking of fluid.   The following portions of the patient's history were reviewed and updated as appropriate: allergies, current medications, past family history, past medical history, past social history, past surgical history and problem list. Problem list updated.  Objective:   Vitals:   12/04/22 1533  BP: (!) 95/59  Pulse: 83  Weight: 135 lb (61.2 kg)    Fetal Status: Fetal Heart Rate (bpm): 147         General:  Alert, oriented and cooperative. Patient is in no acute distress.  Skin: Skin is warm and dry. No rash noted.   Cardiovascular: Normal heart rate noted  Respiratory: Normal respiratory effort, no problems with respiration noted  Abdomen: Soft, gravid, appropriate for gestational age. Pain/Pressure: Present     Pelvic:  Cervical exam deferred        Extremities: Normal range of motion.  Edema: None  Mental Status: Normal mood and affect. Normal behavior. Normal judgment and thought content.   Urinalysis:      Assessment and Plan:  Pregnancy: [id number] 154w2d1. Supervision of other normal pregnancy, antepartum  2. Candida albicans infection Rx: - terconazole (TERAZOL 3) 0.8 % vaginal cream; Place 1 applicator vaginally at bedtime.  Dispense: 20 g; Refill: 0   Preterm labor symptoms and general obstetric precautions including but not limited to vaginal bleeding, contractions, leaking of fluid and fetal movement were reviewed in detail with the patient. Please refer to After Visit Summary for other counseling  recommendations.  No follow-ups on file.   HaShelly BombardMD 12/05/2023

## 2022-12-04 NOTE — Progress Notes (Signed)
ROB, c/o burning and itching after taking Antibiotics.

## 2022-12-08 LAB — PANORAMA PRENATAL TEST FULL PANEL:PANORAMA TEST PLUS 5 ADDITIONAL MICRODELETIONS: FETAL FRACTION: 6.1

## 2022-12-09 ENCOUNTER — Encounter: Payer: Self-pay | Admitting: *Deleted

## 2022-12-09 ENCOUNTER — Ambulatory Visit: Payer: Medicaid Other | Attending: Obstetrics and Gynecology

## 2022-12-09 ENCOUNTER — Other Ambulatory Visit: Payer: Self-pay | Admitting: Maternal & Fetal Medicine

## 2022-12-09 ENCOUNTER — Ambulatory Visit: Payer: Medicaid Other | Admitting: *Deleted

## 2022-12-09 VITALS — BP 109/59 | HR 80

## 2022-12-09 DIAGNOSIS — Z3A19 19 weeks gestation of pregnancy: Secondary | ICD-10-CM | POA: Insufficient documentation

## 2022-12-09 DIAGNOSIS — Z363 Encounter for antenatal screening for malformations: Secondary | ICD-10-CM | POA: Diagnosis not present

## 2022-12-09 DIAGNOSIS — D649 Anemia, unspecified: Secondary | ICD-10-CM | POA: Diagnosis not present

## 2022-12-09 DIAGNOSIS — O26842 Uterine size-date discrepancy, second trimester: Secondary | ICD-10-CM | POA: Insufficient documentation

## 2022-12-09 DIAGNOSIS — O09522 Supervision of elderly multigravida, second trimester: Secondary | ICD-10-CM | POA: Insufficient documentation

## 2022-12-09 DIAGNOSIS — O09529 Supervision of elderly multigravida, unspecified trimester: Secondary | ICD-10-CM | POA: Insufficient documentation

## 2022-12-09 DIAGNOSIS — Z3480 Encounter for supervision of other normal pregnancy, unspecified trimester: Secondary | ICD-10-CM | POA: Diagnosis not present

## 2022-12-09 DIAGNOSIS — Z98891 History of uterine scar from previous surgery: Secondary | ICD-10-CM | POA: Insufficient documentation

## 2022-12-09 DIAGNOSIS — Z362 Encounter for other antenatal screening follow-up: Secondary | ICD-10-CM

## 2022-12-09 DIAGNOSIS — O26892 Other specified pregnancy related conditions, second trimester: Secondary | ICD-10-CM | POA: Insufficient documentation

## 2022-12-10 ENCOUNTER — Encounter: Payer: Self-pay | Admitting: Obstetrics and Gynecology

## 2022-12-10 DIAGNOSIS — Z98891 History of uterine scar from previous surgery: Secondary | ICD-10-CM | POA: Insufficient documentation

## 2022-12-10 DIAGNOSIS — O09529 Supervision of elderly multigravida, unspecified trimester: Secondary | ICD-10-CM | POA: Insufficient documentation

## 2022-12-15 ENCOUNTER — Inpatient Hospital Stay (HOSPITAL_COMMUNITY)
Admission: AD | Admit: 2022-12-15 | Discharge: 2022-12-15 | Disposition: A | Discharge status: Home / Self Care | Payer: Medicaid Other | Attending: Family Medicine | Admitting: Family Medicine

## 2022-12-15 ENCOUNTER — Encounter (HOSPITAL_COMMUNITY): Payer: Self-pay | Admitting: Family Medicine

## 2022-12-15 ENCOUNTER — Inpatient Hospital Stay (HOSPITAL_BASED_OUTPATIENT_CLINIC_OR_DEPARTMENT_OTHER): Payer: Medicaid Other

## 2022-12-15 ENCOUNTER — Inpatient Hospital Stay (HOSPITAL_COMMUNITY): Payer: Medicaid Other

## 2022-12-15 DIAGNOSIS — Z3A19 19 weeks gestation of pregnancy: Secondary | ICD-10-CM

## 2022-12-15 DIAGNOSIS — O34219 Maternal care for unspecified type scar from previous cesarean delivery: Secondary | ICD-10-CM | POA: Diagnosis not present

## 2022-12-15 DIAGNOSIS — R1031 Right lower quadrant pain: Secondary | ICD-10-CM | POA: Diagnosis not present

## 2022-12-15 DIAGNOSIS — O09522 Supervision of elderly multigravida, second trimester: Secondary | ICD-10-CM

## 2022-12-15 DIAGNOSIS — R109 Unspecified abdominal pain: Secondary | ICD-10-CM

## 2022-12-15 DIAGNOSIS — O26892 Other specified pregnancy related conditions, second trimester: Secondary | ICD-10-CM | POA: Diagnosis not present

## 2022-12-15 DIAGNOSIS — O99012 Anemia complicating pregnancy, second trimester: Secondary | ICD-10-CM | POA: Diagnosis not present

## 2022-12-15 DIAGNOSIS — O26842 Uterine size-date discrepancy, second trimester: Secondary | ICD-10-CM

## 2022-12-15 LAB — CBC WITH DIFFERENTIAL/PLATELET
Abs Immature Granulocytes: 0.05 10*3/uL (ref 0.00–0.07)
Basophils Absolute: 0 10*3/uL (ref 0.0–0.1)
Basophils Relative: 0 %
Eosinophils Absolute: 0.2 10*3/uL (ref 0.0–0.5)
Eosinophils Relative: 3 %
HCT: 32.8 % — ABNORMAL LOW (ref 36.0–46.0)
Hemoglobin: 11.4 g/dL — ABNORMAL LOW (ref 12.0–15.0)
Immature Granulocytes: 1 %
Lymphocytes Relative: 22 %
Lymphs Abs: 1.8 10*3/uL (ref 0.7–4.0)
MCH: 32.5 pg (ref 26.0–34.0)
MCHC: 34.8 g/dL (ref 30.0–36.0)
MCV: 93.4 fL (ref 80.0–100.0)
Monocytes Absolute: 0.7 10*3/uL (ref 0.1–1.0)
Monocytes Relative: 8 %
Neutro Abs: 5.6 10*3/uL (ref 1.7–7.7)
Neutrophils Relative %: 66 %
Platelets: 212 10*3/uL (ref 150–400)
RBC: 3.51 MIL/uL — ABNORMAL LOW (ref 3.87–5.11)
RDW: 13.5 % (ref 11.5–15.5)
WBC: 8.4 10*3/uL (ref 4.0–10.5)
nRBC: 0 % (ref 0.0–0.2)

## 2022-12-15 LAB — COMPREHENSIVE METABOLIC PANEL
ALT: 11 U/L (ref 0–44)
AST: 18 U/L (ref 15–41)
Albumin: 3.2 g/dL — ABNORMAL LOW (ref 3.5–5.0)
Alkaline Phosphatase: 51 U/L (ref 38–126)
Anion gap: 11 (ref 5–15)
BUN: 8 mg/dL (ref 6–20)
CO2: 23 mmol/L (ref 22–32)
Calcium: 9.3 mg/dL (ref 8.9–10.3)
Chloride: 103 mmol/L (ref 98–111)
Creatinine, Ser: 0.6 mg/dL (ref 0.44–1.00)
GFR, Estimated: >60 mL/min (ref >60)
Glucose, Bld: 76 mg/dL (ref 70–99)
Potassium: 3.4 mmol/L — ABNORMAL LOW (ref 3.5–5.1)
Sodium: 137 mmol/L (ref 135–145)
Total Bilirubin: 0.5 mg/dL (ref 0.3–1.2)
Total Protein: 6.2 g/dL — ABNORMAL LOW (ref 6.5–8.1)

## 2022-12-15 LAB — URINALYSIS, ROUTINE W REFLEX MICROSCOPIC
Bilirubin Urine: NEGATIVE
Glucose, UA: NEGATIVE mg/dL
Hgb urine dipstick: NEGATIVE
Ketones, ur: NEGATIVE mg/dL
Leukocytes,Ua: NEGATIVE
Nitrite: NEGATIVE
Protein, ur: NEGATIVE mg/dL
Specific Gravity, Urine: 1.006 (ref 1.005–1.030)
pH: 7 (ref 5.0–8.0)

## 2022-12-15 LAB — WET PREP, GENITAL
Clue Cells Wet Prep HPF POC: NONE SEEN
Sperm: NONE SEEN
Trich, Wet Prep: NONE SEEN
WBC, Wet Prep HPF POC: <10 (ref <10)
Yeast Wet Prep HPF POC: NONE SEEN

## 2022-12-15 MED ORDER — ACETAMINOPHEN 500 MG PO TABS: 1000.0000 mg | ORAL_TABLET | Freq: Once | ORAL | Status: DC

## 2022-12-15 MED ORDER — CYCLOBENZAPRINE HCL 10 MG PO TABS: 5.0000 mg | ORAL_TABLET | Freq: Three times a day (TID) | ORAL | 2 refills | Status: DC | PRN

## 2022-12-15 NOTE — Progress Notes (Signed)
Pt to MRI for imaging

## 2022-12-15 NOTE — MAU Provider Note (Signed)
History     CSN: [id number]  Arrival date and time: 12/15/22 0086   None     Chief Complaint  Patient presents with   Abdominal Pain   HPI Patient is a 37 year old G7 P5-0-1-5 at 19 weeks and 6 days who has a history of prior C-section, followed by VBAC x 4.  Patient seen for right lower quadrant abdominal pain that started yesterday.  It started as a cramp that she felt in the front and in the back of her pelvis that were similar to labor contractions that she has had in the past. She called out of work and went home and went to sleep. She woke up this morning with continued constant, nonradiating pain in the RLQ. No fevers, chills, nausea. No real appetite. Pain 6 out of 10.   OB History     Gravida  7   Para  5   Term  5   Preterm      AB  1   Living  5      SAB  1   IAB      Ectopic      Multiple      Live Births  5        Obstetric Comments  Pt was pushing, fh was dropping- ended up with c/s 2013 baby was face up, had problems getting the shoulders out, was bleeding, got a shot         Past Medical History:  Diagnosis Date   Anemia    Anxiety    Fibroadenoma of breast    UTI (urinary tract infection)     Past Surgical History:  Procedure Laterality Date   BREAST LUMPECTOMY Left 2010   BREAST LUMPECTOMY Left 05/01/2022   Procedure: LEFT BREAST LUMPECTOMY;  Surgeon: Erroll Luna, MD;  Location: [place];  Service: General;  Laterality: Left;   BREAST LUMPECTOMY Bilateral 2014   CESAREAN SECTION  2008    Family History  Problem Relation Age of Onset   Healthy Mother    Diabetes Maternal Grandmother    Breast cancer Neg Hx     Social History   Tobacco Use   Smoking status: Former    Packs/day: 1.00    Years: 8.00    Total pack years: 8.00    Types: Cigarettes    Quit date: 09/2022    Years since quitting: 0.2   Smokeless tobacco: Never  Vaping Use   Vaping Use: Never used  Substance Use Topics   Alcohol  use: No   Drug use: No    Allergies: No Known Allergies  Medications Prior to Admission  Medication Sig Dispense Refill Last Dose   Prenat-Fe Poly-Methfol-FA-DHA (VITAFOL ULTRA) 29-0.6-0.4-200 MG CAPS Take 1 capsule by mouth daily. 30 capsule 11 12/14/2022   Blood Pressure Monitoring (BLOOD PRESSURE KIT) DEVI 1 kit by Does not apply route once a week. 1 each 0    fluticasone (FLONASE) 50 MCG/ACT nasal spray Place 1 spray into both nostrils daily. (Patient not taking: Reported on 11/06/2022) 16 g 0    metroNIDAZOLE (FLAGYL) 500 MG tablet Take 1 tablet (500 mg total) by mouth 2 (two) times daily. 14 tablet 2    terconazole (TERAZOL 3) 0.8 % vaginal cream Place 1 applicator vaginally at bedtime. 20 g 0     Review of Systems Physical Exam   Blood pressure (!) 93/57, pulse 79, temperature 98.2 F (36.8 C), temperature source Oral, resp. rate 16, height 5'  8" (1.727 m), weight 61 kg, last menstrual period 07/20/2022, SpO2 99 %.  Physical Exam Vitals reviewed.  Constitutional:      Appearance: She is well-developed.  Abdominal:     General: Abdomen is flat.     Palpations: Abdomen is soft. There is no shifting dullness, hepatomegaly or mass.     Tenderness: There is abdominal tenderness in the right lower quadrant. There is rebound. There is no right CVA tenderness or left CVA tenderness. Positive signs include McBurney's sign and obturator sign. Negative signs include Murphy's sign and psoas sign.  Skin:    General: Skin is warm.  Neurological:     Mental Status: She is alert.   Results for orders placed or performed during the hospital encounter of 12/15/22 (from the past 24 hour(s))  CBC with Differential/Platelet     Status: Abnormal   Collection Time: 12/15/22  7:48 AM  Result Value Ref Range   WBC 8.4 4.0 - 10.5 K/uL   RBC 3.51 (L) 3.87 - 5.11 MIL/uL   Hemoglobin 11.4 (L) 12.0 - 15.0 g/dL   HCT 32.8 (L) 36.0 - 46.0 %   MCV 93.4 80.0 - 100.0 fL   MCH 32.5 26.0 - 34.0 pg   MCHC  34.8 30.0 - 36.0 g/dL   RDW 13.5 11.5 - 15.5 %   Platelets 212 150 - 400 K/uL   nRBC 0.0 0.0 - 0.2 %   Neutrophils Relative % 66 %   Neutro Abs 5.6 1.7 - 7.7 K/uL   Lymphocytes Relative 22 %   Lymphs Abs 1.8 0.7 - 4.0 K/uL   Monocytes Relative 8 %   Monocytes Absolute 0.7 0.1 - 1.0 K/uL   Eosinophils Relative 3 %   Eosinophils Absolute 0.2 0.0 - 0.5 K/uL   Basophils Relative 0 %   Basophils Absolute 0.0 0.0 - 0.1 K/uL   Immature Granulocytes 1 %   Abs Immature Granulocytes 0.05 0.00 - 0.07 K/uL  Wet prep, genital     Status: None   Collection Time: 12/15/22  7:52 AM   Specimen: Vaginal  Result Value Ref Range   Yeast Wet Prep HPF POC NONE SEEN NONE SEEN   Trich, Wet Prep NONE SEEN NONE SEEN   Clue Cells Wet Prep HPF POC NONE SEEN NONE SEEN   WBC, Wet Prep HPF POC <10 <10   Sperm NONE SEEN    MR PELVIS WO CONTRAST  Result Date: 12/15/2022 CLINICAL DATA:  Acute abdominal pain. Second trimester pregnancy. Clinical suspicion for appendicitis. EXAM: MRI ABDOMEN AND PELVIS WITHOUT CONTRAST TECHNIQUE: Multiplanar multisequence MR imaging of the abdomen and pelvis was performed. No intravenous contrast was administered. COMPARISON:  None Available. FINDINGS: COMBINED FINDINGS FOR BOTH MR ABDOMEN AND PELVIS Lower chest: No acute findings. Hepatobiliary: No mass visualized on this unenhanced exam. Gallbladder is unremarkable. No evidence of biliary ductal dilatation. Pancreas: No mass or inflammatory process visualized on this unenhanced exam. Spleen:  Within normal limits in size. Adrenals/Urinary tract: No evidence of renal mass or hydronephrosis. Unremarkable unopacified urinary bladder. Stomach/Bowel: Although the appendix is not directly visualized, no inflammatory process seen in region of the cecum or elsewhere. No evidence of dilated bowel loops Vascular/Lymphatic: No pathologically enlarged lymph nodes identified. No evidence of abdominal aortic aneurysm. Reproductive: Single intrauterine  fetus is seen. No adnexal mass identified. Tiny amount of free fluid noted in cul-de-sac. Other:  None. Musculoskeletal:  No suspicious bone lesions identified. IMPRESSION: No radiographic evidence of appendicitis or other acute findings.  Single intrauterine pregnancy. Electronically Signed   By: Marlaine Hind M.D.   On: 12/15/2022 12:32   MR ABDOMEN WO CONTRAST  Result Date: 12/15/2022 CLINICAL DATA:  Acute abdominal pain. Second trimester pregnancy. Clinical suspicion for appendicitis. EXAM: MRI ABDOMEN AND PELVIS WITHOUT CONTRAST TECHNIQUE: Multiplanar multisequence MR imaging of the abdomen and pelvis was performed. No intravenous contrast was administered. COMPARISON:  None Available. FINDINGS: COMBINED FINDINGS FOR BOTH MR ABDOMEN AND PELVIS Lower chest: No acute findings. Hepatobiliary: No mass visualized on this unenhanced exam. Gallbladder is unremarkable. No evidence of biliary ductal dilatation. Pancreas: No mass or inflammatory process visualized on this unenhanced exam. Spleen:  Within normal limits in size. Adrenals/Urinary tract: No evidence of renal mass or hydronephrosis. Unremarkable unopacified urinary bladder. Stomach/Bowel: Although the appendix is not directly visualized, no inflammatory process seen in region of the cecum or elsewhere. No evidence of dilated bowel loops Vascular/Lymphatic: No pathologically enlarged lymph nodes identified. No evidence of abdominal aortic aneurysm. Reproductive: Single intrauterine fetus is seen. No adnexal mass identified. Tiny amount of free fluid noted in cul-de-sac. Other:  None. Musculoskeletal:  No suspicious bone lesions identified. IMPRESSION: No radiographic evidence of appendicitis or other acute findings. Single intrauterine pregnancy. Electronically Signed   By: Marlaine Hind M.D.   On: 12/15/2022 12:32      MAU Course  Procedures  MDM CBC, CMP, wet prep,  Due to rebound tenderness in RLQ around area of appendix, will get MRI to r/o  appendicitis.  Will also check [place] for obstetrical causes of pain  Imaging: I independent reviewed the images of the [place]. No ovarian cyst or fibroid on right side. No reason for pain.   Assessment and Plan   1. [redacted] weeks gestation of pregnancy   2. RLQ abdominal pain    No appendicitis or identifiable obstetrical cause of pain. D/c to home with flexeril, tylenol. F/u tomorrow in office.  Truett Mainland 12/15/2022, 8:27 AM

## 2022-12-15 NOTE — MAU Note (Signed)
Laurie Friedman is a 37 y.o. at 25w6dhere in MAU reporting: felt like she was having contractions yesterday, front and back.  Finally the back stopped.   Pain in RLQ woke her this morning.  Is now a constant pain, worse when she stands. Recent treatment for UTI followed by yeast inf.  Denies fever or bleeding.  Little constipation, not a new problem  Onset of complaint: 0200 Pain score: 6 Vitals:   12/15/22 0729  BP: 101/61  Pulse: 76  Resp: 16  Temp: 98.2 F (36.8 C)  SpO2: 100%     FHT:140 Lab orders placed from triage:  urine

## 2022-12-15 NOTE — Discharge Instructions (Signed)
Your MRI was normal. You can use the flexeril to help with the pain. Please be cautious with this medication as it can make you sleepy.

## 2022-12-15 NOTE — Progress Notes (Signed)
Pt requesting food, states hasn't eaten since last night.  Reports has a HA.  Instructed couldn't eat until MRI results, but would notify MD of HA so meds could be ordered to treat HA.

## 2022-12-16 ENCOUNTER — Ambulatory Visit (INDEPENDENT_AMBULATORY_CARE_PROVIDER_SITE_OTHER): Payer: Medicaid Other | Admitting: Obstetrics and Gynecology

## 2022-12-16 ENCOUNTER — Encounter: Payer: Self-pay | Admitting: Obstetrics and Gynecology

## 2022-12-16 VITALS — BP 96/61 | HR 80 | Wt 134.0 lb

## 2022-12-16 DIAGNOSIS — Z3A20 20 weeks gestation of pregnancy: Secondary | ICD-10-CM

## 2022-12-16 DIAGNOSIS — O34211 Maternal care for low transverse scar from previous cesarean delivery: Secondary | ICD-10-CM

## 2022-12-16 DIAGNOSIS — Z98891 History of uterine scar from previous surgery: Secondary | ICD-10-CM

## 2022-12-16 DIAGNOSIS — O09522 Supervision of elderly multigravida, second trimester: Secondary | ICD-10-CM

## 2022-12-16 DIAGNOSIS — Z302 Encounter for sterilization: Secondary | ICD-10-CM | POA: Insufficient documentation

## 2022-12-16 DIAGNOSIS — Z3480 Encounter for supervision of other normal pregnancy, unspecified trimester: Secondary | ICD-10-CM

## 2022-12-16 MED ORDER — ASPIRIN 81 MG PO TBEC: 81.0000 mg | DELAYED_RELEASE_TABLET | Freq: Every day | ORAL | 12 refills | Status: DC

## 2022-12-16 NOTE — Progress Notes (Signed)
Pt was recently seen in MAU for lower pelvic pain. Pt still having pain - dull constant pain.  Pain is worse when standing for long periods of time, at work.  Pt states she is constipated.

## 2022-12-16 NOTE — Progress Notes (Signed)
   PRENATAL VISIT NOTE  Subjective:  Laurie Friedman is a 37 y.o. [id number] at 40w0dbeing seen today for ongoing prenatal care.  She is currently monitored for the following issues for this low-risk pregnancy and has Benign tumor of breast; Supervision of other normal pregnancy, antepartum; History of cesarean section; and Advanced maternal age in multigravida on their problem list.  Patient reports  RLQ pain/pressure - seen in MAU and work up unremarkable. Tolerating PO without issue. Also has constipation .  Contractions: Not present. Vag. Bleeding: None.  Movement: Present. Denies leaking of fluid.   The following portions of the patient's history were reviewed and updated as appropriate: allergies, current medications, past family history, past medical history, past social history, past surgical history and problem list.   Objective:   Vitals:   12/16/22 0943  BP: 96/61  Pulse: 80  Weight: 134 lb (60.8 kg)   Fetal Status: Fetal Heart Rate (bpm): 140   Movement: Present     General:  Alert, oriented and cooperative. Patient is in no acute distress.  Skin: Skin is warm and dry. No rash noted.   Cardiovascular: Normal heart rate noted  Respiratory: Normal respiratory effort, no problems with respiration noted  Abdomen: Soft, gravid, non tender, appropriate for gestational age.  Pain/Pressure: Present      Assessment and Plan:  Pregnancy: [id number] 287w0d. Supervision of other normal pregnancy, antepartum Discussed current level of symptoms consistent with round ligament & normal pregnancy pain Anatomy USKoreancomplete - f/u USKoreacheduled  2. History of cesarean section VBAC x4 Will sign TOLAC consent closer to time of delivery  3. Multigravida of advanced maternal age in second trimester ldASA - reviewed with patient today, rx sent  4. Request for sterilization Briefly discussed option for post partum tubal  Tubal papers signed Will discuss in more depth at next  appointment  Please refer to After Visit Summary for other counseling recommendations.   Return in about 4 weeks (around 01/13/2023) for return OB.  Future Appointments  Date Time Provider DeIder3/03/2023  8:35 AM Constant, PeVickii ChafeMD [facility] None  01/15/2023  8:15 AM [facility]  8:30 AM [facility] US3 [facility]   KyInez CatalinaMD

## 2022-12-16 NOTE — Patient Instructions (Signed)
Helpful things for constipation  - Kiwi fruit with skin on (only 1 per day!) - Increase fiber in diet and drink lots of water - Over the counter laxatives - Miralax or senna daily. Senna can cause more cramping, so Miralax might be most helpful. Drink with lots of water or you can get dehydrated

## 2023-01-13 ENCOUNTER — Ambulatory Visit (INDEPENDENT_AMBULATORY_CARE_PROVIDER_SITE_OTHER): Payer: Medicaid Other | Admitting: Obstetrics and Gynecology

## 2023-01-13 ENCOUNTER — Encounter: Payer: Self-pay | Admitting: Obstetrics and Gynecology

## 2023-01-13 VITALS — BP 101/62 | HR 83 | Wt 140.0 lb

## 2023-01-13 DIAGNOSIS — Z98891 History of uterine scar from previous surgery: Secondary | ICD-10-CM

## 2023-01-13 DIAGNOSIS — Z3A24 24 weeks gestation of pregnancy: Secondary | ICD-10-CM

## 2023-01-13 DIAGNOSIS — Z3480 Encounter for supervision of other normal pregnancy, unspecified trimester: Secondary | ICD-10-CM

## 2023-01-13 DIAGNOSIS — O09522 Supervision of elderly multigravida, second trimester: Secondary | ICD-10-CM

## 2023-01-13 DIAGNOSIS — Z302 Encounter for sterilization: Secondary | ICD-10-CM

## 2023-01-13 NOTE — Progress Notes (Signed)
   PRENATAL VISIT NOTE  Subjective:  Laurie Friedman is a 37 y.o. [id number] at 29w0dbeing seen today for ongoing prenatal care.  She is currently monitored for the following issues for this low-risk pregnancy and has Benign tumor of breast; Supervision of other normal pregnancy, antepartum; History of cesarean section; Advanced maternal age in multigravida; and Request for sterilization on their problem list.  Patient reports no complaints.  Contractions: Not present. Vag. Bleeding: None.  Movement: Present. Denies leaking of fluid.   The following portions of the patient's history were reviewed and updated as appropriate: allergies, current medications, past family history, past medical history, past social history, past surgical history and problem list.   Objective:   Vitals:   01/13/23 0855  BP: 101/62  Pulse: 83  Weight: 140 lb (63.5 kg)    Fetal Status: Fetal Heart Rate (bpm): 144 Fundal Height: 23 cm Movement: Present     General:  Alert, oriented and cooperative. Patient is in no acute distress.  Skin: Skin is warm and dry. No rash noted.   Cardiovascular: Normal heart rate noted  Respiratory: Normal respiratory effort, no problems with respiration noted  Abdomen: Soft, gravid, appropriate for gestational age.  Pain/Pressure: Absent     Pelvic: Cervical exam deferred        Extremities: Normal range of motion.  Edema: None  Mental Status: Normal mood and affect. Normal behavior. Normal judgment and thought content.   Assessment and Plan:  Pregnancy: [id number] 298w0d. Supervision of other normal pregnancy, antepartum Patient is doing well without complaints Third trimester labs and glucola next visit  2. History of cesarean section Desires another TOLAC- will sign consent at next visit  3. Multigravida of advanced maternal age in second trimester   4. Request for sterilization Sterilization form signed Patient is considering LARC  Preterm labor symptoms and general  obstetric precautions including but not limited to vaginal bleeding, contractions, leaking of fluid and fetal movement were reviewed in detail with the patient. Please refer to After Visit Summary for other counseling recommendations.   Return in about 4 weeks (around 02/10/2023) for in person, ROB, Low risk, 2 hr glucola next visit.  Future Appointments  Date Time Provider [place]/05[place]20[place]  8:15 AM [facility]/05/2023  8:30 AM [facility]4/12/2022  8:35 AM PaDeloris PingCNM [facility] None    PeMora BellmanMD

## 2023-01-13 NOTE — Progress Notes (Signed)
ROB   CC: None  Per last notes discuss BTL today in more detail.

## 2023-01-15 ENCOUNTER — Ambulatory Visit: Payer: Medicaid Other

## 2023-01-15 ENCOUNTER — Other Ambulatory Visit: Payer: Self-pay

## 2023-01-15 ENCOUNTER — Ambulatory Visit: Payer: Medicaid Other | Attending: Maternal & Fetal Medicine

## 2023-01-15 VITALS — BP 110/63 | HR 83

## 2023-01-15 DIAGNOSIS — D649 Anemia, unspecified: Secondary | ICD-10-CM

## 2023-01-15 DIAGNOSIS — O34219 Maternal care for unspecified type scar from previous cesarean delivery: Secondary | ICD-10-CM

## 2023-01-15 DIAGNOSIS — O09522 Supervision of elderly multigravida, second trimester: Secondary | ICD-10-CM | POA: Diagnosis not present

## 2023-01-15 DIAGNOSIS — O26842 Uterine size-date discrepancy, second trimester: Secondary | ICD-10-CM | POA: Diagnosis not present

## 2023-01-15 DIAGNOSIS — Z3A24 24 weeks gestation of pregnancy: Secondary | ICD-10-CM | POA: Insufficient documentation

## 2023-01-15 DIAGNOSIS — O0942 Supervision of pregnancy with grand multiparity, second trimester: Secondary | ICD-10-CM | POA: Insufficient documentation

## 2023-01-15 DIAGNOSIS — Z362 Encounter for other antenatal screening follow-up: Secondary | ICD-10-CM | POA: Diagnosis present

## 2023-01-15 DIAGNOSIS — Z3480 Encounter for supervision of other normal pregnancy, unspecified trimester: Secondary | ICD-10-CM

## 2023-01-15 DIAGNOSIS — O99012 Anemia complicating pregnancy, second trimester: Secondary | ICD-10-CM | POA: Diagnosis not present

## 2023-01-15 DIAGNOSIS — O365920 Maternal care for other known or suspected poor fetal growth, second trimester, not applicable or unspecified: Secondary | ICD-10-CM

## 2023-02-10 ENCOUNTER — Encounter: Payer: Self-pay | Admitting: Certified Nurse Midwife

## 2023-02-10 ENCOUNTER — Ambulatory Visit (INDEPENDENT_AMBULATORY_CARE_PROVIDER_SITE_OTHER): Payer: Medicaid Other | Admitting: Certified Nurse Midwife

## 2023-02-10 ENCOUNTER — Other Ambulatory Visit: Payer: Medicaid Other

## 2023-02-10 VITALS — BP 100/61 | HR 83 | Wt 147.6 lb

## 2023-02-10 DIAGNOSIS — Z23 Encounter for immunization: Secondary | ICD-10-CM | POA: Diagnosis not present

## 2023-02-10 DIAGNOSIS — Z3480 Encounter for supervision of other normal pregnancy, unspecified trimester: Secondary | ICD-10-CM

## 2023-02-10 DIAGNOSIS — M7989 Other specified soft tissue disorders: Secondary | ICD-10-CM

## 2023-02-10 DIAGNOSIS — O0992 Supervision of high risk pregnancy, unspecified, second trimester: Secondary | ICD-10-CM

## 2023-02-10 DIAGNOSIS — Z98891 History of uterine scar from previous surgery: Secondary | ICD-10-CM

## 2023-02-10 DIAGNOSIS — Z3A28 28 weeks gestation of pregnancy: Secondary | ICD-10-CM

## 2023-02-10 NOTE — Progress Notes (Signed)
   PRENATAL VISIT NOTE  Subjective:  Laurie Friedman is a 37 y.o. [id number] at [redacted]w[redacted]d being seen today for ongoing prenatal care.  She is currently monitored for the following issues for this high-risk pregnancy and has Benign tumor of breast; Supervision of other normal pregnancy, antepartum; History of cesarean section; Advanced maternal age in multigravida; and Request for sterilization on their problem list.  Patient reports  bilateral leg swelling after standing on her feet at work all day.  .  Contractions: Not present. Vag. Bleeding: None.  Movement: Present. Denies leaking of fluid.   The following portions of the patient's history were reviewed and updated as appropriate: allergies, current medications, past family history, past medical history, past social history, past surgical history and problem list.   Objective:   Vitals:   02/10/23 0832  BP: 100/61  Pulse: 83  Weight: 147 lb 9.6 oz (67 kg)    Fetal Status: Fetal Heart Rate (bpm): 135 Fundal Height: 25 cm Movement: Present     General:  Alert, oriented and cooperative. Patient is in no acute distress.  Skin: Skin is warm and dry. No rash noted.   Cardiovascular: Normal heart rate noted  Respiratory: Normal respiratory effort, no problems with respiration noted  Abdomen: Soft, gravid, appropriate for gestational age.  Pain/Pressure: Present     Pelvic: Cervical exam deferred        Extremities: Normal range of motion.  Edema: Trace today ankles and feet. Non-pitting   Mental Status: Normal mood and affect. Normal behavior. Normal judgment and thought content.   Assessment and Plan:  Pregnancy: [id number] at [redacted]w[redacted]d 1. Supervision of high risk pregnancy in second trimester - Patient feeling frequent and vigorous fetal movement   2. History of cesarean delivery - TOLAC consents signed today.   3. [redacted] weeks gestation of pregnancy - GTT today  - Glucose Tolerance, 2 Hours w/1 Hour - HIV antibody (with reflex) - RPR -  CBC   5. Swelling of both lower extremities - Recommended to wear compression stockings while at work.  - Also discussed elevation of feet at night to help decrease swelling.   Preterm labor symptoms and general obstetric precautions including but not limited to vaginal bleeding, contractions, leaking of fluid and fetal movement were reviewed in detail with the patient. Please refer to After Visit Summary for other counseling recommendations.   Return in about 2 weeks (around 02/24/2023) for LOB.  Future Appointments  Date Time Provider [place]  02/12/2023  1:45 PM [facility]URSE [facility]  02/12/2023  2:00 PM [facility] US1 [facility]  02/24/2023  8:35 AM Inez Catalina, MD [facility] None   Laurie Friedman Laurie Friedman) Laurie Rotunda, MSN, [place] for [place]  02/10/23 12:42 PM

## 2023-02-10 NOTE — Progress Notes (Signed)
Pt presents for ROB visit. Pt c/o swelling in the leg and feet for 2 days. No other concerns.

## 2023-02-11 LAB — CBC
Hematocrit: 31.3 % — ABNORMAL LOW (ref 34.0–46.6)
Hemoglobin: 10.5 g/dL — ABNORMAL LOW (ref 11.1–15.9)
MCH: 32.4 pg (ref 26.6–33.0)
MCHC: 33.5 g/dL (ref 31.5–35.7)
MCV: 97 fL (ref 79–97)
Platelets: 217 10*3/uL (ref 150–450)
RBC: 3.24 x10E6/uL — ABNORMAL LOW (ref 3.77–5.28)
RDW: 11.9 % (ref 11.7–15.4)
WBC: 11 10*3/uL — ABNORMAL HIGH (ref 3.4–10.8)

## 2023-02-11 LAB — RPR: RPR Ser Ql: NONREACTIVE

## 2023-02-11 LAB — GLUCOSE TOLERANCE, 2 HOURS W/ 1HR
Glucose, 1 hour: 121 mg/dL (ref 70–179)
Glucose, 2 hour: 83 mg/dL (ref 70–152)
Glucose, Fasting: 79 mg/dL (ref 70–91)

## 2023-02-11 LAB — HIV ANTIBODY (ROUTINE TESTING W REFLEX): HIV Screen 4th Generation wRfx: NONREACTIVE

## 2023-02-12 ENCOUNTER — Ambulatory Visit: Payer: Medicaid Other | Admitting: *Deleted

## 2023-02-12 ENCOUNTER — Other Ambulatory Visit: Payer: Self-pay | Admitting: *Deleted

## 2023-02-12 ENCOUNTER — Other Ambulatory Visit: Payer: Self-pay | Admitting: Obstetrics

## 2023-02-12 ENCOUNTER — Ambulatory Visit: Payer: Medicaid Other | Attending: Obstetrics

## 2023-02-12 VITALS — BP 121/63 | HR 84

## 2023-02-12 DIAGNOSIS — O09523 Supervision of elderly multigravida, third trimester: Secondary | ICD-10-CM | POA: Diagnosis not present

## 2023-02-12 DIAGNOSIS — O365930 Maternal care for other known or suspected poor fetal growth, third trimester, not applicable or unspecified: Secondary | ICD-10-CM | POA: Diagnosis not present

## 2023-02-12 DIAGNOSIS — O365920 Maternal care for other known or suspected poor fetal growth, second trimester, not applicable or unspecified: Secondary | ICD-10-CM | POA: Diagnosis present

## 2023-02-12 DIAGNOSIS — O34219 Maternal care for unspecified type scar from previous cesarean delivery: Secondary | ICD-10-CM

## 2023-02-12 DIAGNOSIS — D649 Anemia, unspecified: Secondary | ICD-10-CM

## 2023-02-12 DIAGNOSIS — Z3A28 28 weeks gestation of pregnancy: Secondary | ICD-10-CM

## 2023-02-12 DIAGNOSIS — O26843 Uterine size-date discrepancy, third trimester: Secondary | ICD-10-CM | POA: Diagnosis not present

## 2023-02-12 DIAGNOSIS — O99013 Anemia complicating pregnancy, third trimester: Secondary | ICD-10-CM

## 2023-02-12 DIAGNOSIS — O0943 Supervision of pregnancy with grand multiparity, third trimester: Secondary | ICD-10-CM

## 2023-02-24 ENCOUNTER — Encounter: Payer: Self-pay | Admitting: Obstetrics and Gynecology

## 2023-02-24 ENCOUNTER — Ambulatory Visit (INDEPENDENT_AMBULATORY_CARE_PROVIDER_SITE_OTHER): Payer: Medicaid Other | Admitting: Obstetrics and Gynecology

## 2023-02-24 VITALS — BP 119/73 | HR 90 | Wt 149.0 lb

## 2023-02-24 DIAGNOSIS — Z302 Encounter for sterilization: Secondary | ICD-10-CM

## 2023-02-24 DIAGNOSIS — Z98891 History of uterine scar from previous surgery: Secondary | ICD-10-CM

## 2023-02-24 DIAGNOSIS — O365990 Maternal care for other known or suspected poor fetal growth, unspecified trimester, not applicable or unspecified: Secondary | ICD-10-CM | POA: Insufficient documentation

## 2023-02-24 DIAGNOSIS — Z3A30 30 weeks gestation of pregnancy: Secondary | ICD-10-CM

## 2023-02-24 DIAGNOSIS — Z3480 Encounter for supervision of other normal pregnancy, unspecified trimester: Secondary | ICD-10-CM

## 2023-02-24 NOTE — Progress Notes (Signed)
Pt presents for ROB without concerns today.  

## 2023-02-24 NOTE — Progress Notes (Signed)
   PRENATAL VISIT NOTE  Subjective:  Laurie Friedman is a 37 y.o. [id number] at [redacted]w[redacted]d being seen today for ongoing prenatal care.  She is currently monitored for the following issues for this high-risk pregnancy and has Benign tumor of breast; Supervision of other normal pregnancy, antepartum; History of cesarean section; Advanced maternal age in multigravida; and Request for sterilization on their problem list.  Patient reports  pelvic pressure, feels its what she's expecting for pregnancy but different from her pregnancies when she was younger .  Contractions: Regular. Vag. Bleeding: None.  Movement: Present. Denies leaking of fluid.   The following portions of the patient's history were reviewed and updated as appropriate: allergies, current medications, past family history, past medical history, past social history, past surgical history and problem list.   Objective:   Vitals:   02/24/23 0841  BP: 119/73  Pulse: 90  Weight: 149 lb (67.6 kg)    Fetal Status: Fetal Heart Rate (bpm): 161   Movement: Present     General:  Alert, oriented and cooperative. Patient is in no acute distress.  Skin: Skin is warm and dry. No rash noted.   Cardiovascular: Normal heart rate noted  Respiratory: Normal respiratory effort, no problems with respiration noted  Abdomen: Soft, gravid, appropriate for gestational age.  Pain/Pressure: Present      Assessment and Plan:  Pregnancy: [id number] at [redacted]w[redacted]d  Supervision of other normal pregnancy, antepartum [redacted] weeks gestation of pregnancy  Fetal growth restriction antepartum @ 28/2: 1047g (9%), AC 15%, nml UAD  Follow up growth scheduled 4/26  History of cesarean section Desires TOLAC, consent previously signed  Request for sterilization Reviewed that sterilization is a permanent, irreversible procedure. Discussed that we typically perform a salpingectomy for lower failure rates and potential lower risk of ovarian cancer. This cannot be reversed. This also  carries risk of bleeding, infection, or injury to surrounding organs.  After discussion, Ms. Ewalt no longer desires tubal sterilization due to permanency of procedure.  Discussed LARC - she is interested in IUD. Discussed option for postplacental placement vs interval. Reviewed major difference is higher rate of expulsion when placed postplacentally, but could avoid an office procedure.  She would like postplacental IUD insertion. Problem list updated.   Please refer to After Visit Summary for other counseling recommendations.   Return in about 2 weeks (around 03/10/2023) for return OB at 32 weeks.  Future Appointments  Date Time Provider Department Center  03/06/2023  8:30 AM [facility] NURSE [facility]  03/06/2023  8:45 AM [facility] US5 [facility]   Lennart Pall, MD

## 2023-03-04 ENCOUNTER — Ambulatory Visit (INDEPENDENT_AMBULATORY_CARE_PROVIDER_SITE_OTHER): Payer: Medicaid Other | Admitting: Obstetrics

## 2023-03-04 ENCOUNTER — Encounter (HOSPITAL_COMMUNITY): Payer: Self-pay | Admitting: Obstetrics & Gynecology

## 2023-03-04 ENCOUNTER — Encounter: Payer: Self-pay | Admitting: Obstetrics

## 2023-03-04 ENCOUNTER — Inpatient Hospital Stay (HOSPITAL_COMMUNITY)
Admission: AD | Admit: 2023-03-04 | Discharge: 2023-03-04 | Disposition: A | Discharge status: Home / Self Care | Payer: Medicaid Other | Attending: Obstetrics & Gynecology | Admitting: Obstetrics & Gynecology

## 2023-03-04 VITALS — BP 110/67 | HR 87

## 2023-03-04 DIAGNOSIS — O4703 False labor before 37 completed weeks of gestation, third trimester: Secondary | ICD-10-CM | POA: Insufficient documentation

## 2023-03-04 DIAGNOSIS — O26893 Other specified pregnancy related conditions, third trimester: Secondary | ICD-10-CM | POA: Insufficient documentation

## 2023-03-04 DIAGNOSIS — O09523 Supervision of elderly multigravida, third trimester: Secondary | ICD-10-CM

## 2023-03-04 DIAGNOSIS — O0992 Supervision of high risk pregnancy, unspecified, second trimester: Secondary | ICD-10-CM

## 2023-03-04 DIAGNOSIS — R102 Pelvic and perineal pain: Secondary | ICD-10-CM | POA: Diagnosis not present

## 2023-03-04 DIAGNOSIS — O365990 Maternal care for other known or suspected poor fetal growth, unspecified trimester, not applicable or unspecified: Secondary | ICD-10-CM

## 2023-03-04 DIAGNOSIS — Z302 Encounter for sterilization: Secondary | ICD-10-CM

## 2023-03-04 DIAGNOSIS — Z3A31 31 weeks gestation of pregnancy: Secondary | ICD-10-CM | POA: Diagnosis not present

## 2023-03-04 DIAGNOSIS — Z98891 History of uterine scar from previous surgery: Secondary | ICD-10-CM

## 2023-03-04 LAB — URINALYSIS, ROUTINE W REFLEX MICROSCOPIC
Bilirubin Urine: NEGATIVE
Glucose, UA: NEGATIVE mg/dL
Hgb urine dipstick: NEGATIVE
Ketones, ur: NEGATIVE mg/dL
Nitrite: NEGATIVE
Protein, ur: NEGATIVE mg/dL
Specific Gravity, Urine: 1.008 (ref 1.005–1.030)
pH: 6 (ref 5.0–8.0)

## 2023-03-04 LAB — WET PREP, GENITAL
Clue Cells Wet Prep HPF POC: NONE SEEN
Sperm: NONE SEEN
Trich, Wet Prep: NONE SEEN
WBC, Wet Prep HPF POC: <10 (ref <10)
Yeast Wet Prep HPF POC: NONE SEEN

## 2023-03-04 MED ORDER — LACTATED RINGERS IV BOLUS
1000.0000 mL | Freq: Once | INTRAVENOUS | Status: AC
  Administered 2023-03-04: 1000 mL via INTRAVENOUS

## 2023-03-04 MED ORDER — TERBUTALINE SULFATE 1 MG/ML IJ SOLN
0.2500 mg | Freq: Once | INTRAMUSCULAR | Status: AC
  Administered 2023-03-04: 0.25 mg via SUBCUTANEOUS
  Filled 2023-03-04: qty 1

## 2023-03-04 MED ORDER — NIFEDIPINE 10 MG PO CAPS
10.0000 mg | ORAL_CAPSULE | ORAL | Status: AC
  Administered 2023-03-04: 10 mg via ORAL
  Filled 2023-03-04 (×2): qty 1

## 2023-03-04 NOTE — MAU Provider Note (Signed)
History     CSN: [id number]  Arrival date and time: 03/04/23 1709  Chief Complaint  Patient presents with   Abdominal Pain   Pelvic Pain   36 y.o. [id number] @31 .1 wks presenting with ctx and pelvic pressure. Reports onset 3 days ago. Unsure of ctx frequency but maybe q5-6 min. Reports increased pelvic and rectal pressure, hurts to sit. Denies VB or LOF. Denies urinary sx.     OB History     Gravida  7   Para  5   Term  5   Preterm      AB  1   Living  5      SAB  1   IAB      Ectopic      Multiple      Live Births  5        Obstetric Comments  Pt was pushing, fh was dropping- ended up with c/s 2013 baby was face up, had problems getting the shoulders out, was bleeding, got a shot         Past Medical History:  Diagnosis Date   Anemia    Anxiety    Fibroadenoma of breast    UTI (urinary tract infection)     Past Surgical History:  Procedure Laterality Date   BREAST LUMPECTOMY Left 2010   BREAST LUMPECTOMY Left 05/01/2022   Procedure: LEFT BREAST LUMPECTOMY;  Surgeon: Harriette Bouillon, MD;  Location: [facility];  Service: General;  Laterality: Left;   BREAST LUMPECTOMY Bilateral 2014   CESAREAN SECTION  2008    Family History  Problem Relation Age of Onset   Healthy Mother    Diabetes Maternal Grandmother    Breast cancer Neg Hx     Social History   Tobacco Use   Smoking status: Former    Packs/day: 1.00    Years: 8.00    Additional pack years: 0.00    Total pack years: 8.00    Types: Cigarettes    Quit date: 09/2022    Years since quitting: 0.4   Smokeless tobacco: Never  Vaping Use   Vaping Use: Never used  Substance Use Topics   Alcohol use: No   Drug use: No    Allergies: No Known Allergies  Medications Prior to Admission  Medication Sig Dispense Refill Last Dose   aspirin EC 81 MG tablet Take 1 tablet (81 mg total) by mouth daily. Swallow whole. 30 tablet 12 03/04/2023   Prenat-Fe Poly-Methfol-FA-DHA  (VITAFOL ULTRA) 29-0.6-0.4-200 MG CAPS Take 1 capsule by mouth daily. 30 capsule 11 03/04/2023   Blood Pressure Monitoring (BLOOD PRESSURE KIT) DEVI 1 kit by Does not apply route once a week. (Patient not taking: Reported on 03/04/2023) 1 each 0    cyclobenzaprine (FLEXERIL) 10 MG tablet Take 0.5-1 tablets (5-10 mg total) by mouth 3 (three) times daily as needed for muscle spasms. (Patient not taking: Reported on 01/13/2023) 30 tablet 2    fluticasone (FLONASE) 50 MCG/ACT nasal spray Place 1 spray into both nostrils daily. (Patient not taking: Reported on 11/06/2022) 16 g 0     Review of Systems  Gastrointestinal:  Positive for abdominal pain.  Genitourinary:  Positive for pelvic pain. Negative for decreased urine volume, dysuria and vaginal bleeding.   Physical Exam   Blood pressure (!) 98/48, pulse 93, temperature 98.2 F (36.8 C), resp. rate 15, height 5\' 8"  (1.727 m), weight 68.5 kg, last menstrual period 07/20/2022, SpO2 100 %.  Physical Exam Vitals  and nursing note reviewed. Exam conducted with a chaperone present.  Constitutional:      General: She is in acute distress (restless in bed).     Appearance: Normal appearance.  HENT:     Head: Normocephalic and atraumatic.  Cardiovascular:     Rate and Rhythm: Normal rate.  Pulmonary:     Effort: Pulmonary effort is normal. No respiratory distress.  Abdominal:     Palpations: Abdomen is soft.     Tenderness: There is no abdominal tenderness.  Genitourinary:    Comments: VE: closed/thick/0 Musculoskeletal:        General: Normal range of motion.     Cervical back: Normal range of motion.  Skin:    General: Skin is warm and dry.  Neurological:     General: No focal deficit present.     Mental Status: She is alert and oriented to person, place, and time.  Psychiatric:        Mood and Affect: Mood normal.        Behavior: Behavior normal.   EFM: 125 bpm, mod variability, + accels, no decels Toco: UI  Results for orders placed  or performed during the hospital encounter of 03/04/23 (from the past 24 hour(s))  Urinalysis, Routine w reflex microscopic -Urine, Clean Catch     Status: Abnormal   Collection Time: 03/04/23  5:47 PM  Result Value Ref Range   Color, Urine YELLOW YELLOW   APPearance HAZY (A) CLEAR   Specific Gravity, Urine 1.008 1.005 - 1.030   pH 6.0 5.0 - 8.0   Glucose, UA NEGATIVE NEGATIVE mg/dL   Hgb urine dipstick NEGATIVE NEGATIVE   Bilirubin Urine NEGATIVE NEGATIVE   Ketones, ur NEGATIVE NEGATIVE mg/dL   Protein, ur NEGATIVE NEGATIVE mg/dL   Nitrite NEGATIVE NEGATIVE   Leukocytes,Ua TRACE (A) NEGATIVE   RBC / HPF 0-5 0 - 5 RBC/hpf   WBC, UA 0-5 0 - 5 WBC/hpf   Bacteria, UA MANY (A) NONE SEEN   Squamous Epithelial / HPF 6-10 0 - 5 /HPF   Mucus PRESENT   Wet prep, genital     Status: None   Collection Time: 03/04/23  6:01 PM  Result Value Ref Range   Yeast Wet Prep HPF POC NONE SEEN NONE SEEN   Trich, Wet Prep NONE SEEN NONE SEEN   Clue Cells Wet Prep HPF POC NONE SEEN NONE SEEN   WBC, Wet Prep HPF POC <10 <10   Sperm NONE SEEN    MAU Course  Procedures LR Procardia Terbutaline  MDM Labs ordered and reviewed.  Reports improvement after fluids and Terb. Minimal UI on toco. Discussed comfort measures for late trimester pelvic discomfort. Stable for discharge home.  Assessment and Plan   1. Supervision of other normal pregnancy, antepartum   2. [redacted] weeks gestation of pregnancy   3. Preterm uterine contractions in third trimester, antepartum    Discharge home Follow up at [facility] as scheduled PTL precautions  Allergies as of 03/04/2023   No Known Allergies      Medication List     TAKE these medications    aspirin EC 81 MG tablet Take 1 tablet (81 mg total) by mouth daily. Swallow whole.   Blood Pressure Kit Devi 1 kit by Does not apply route once a week.   cyclobenzaprine 10 MG tablet Commonly known as: FLEXERIL Take 0.5-1 tablets (5-10 mg total) by mouth 3 (three)  times daily as needed for muscle spasms.   fluticasone 50  MCG/ACT nasal spray Commonly known as: FLONASE Place 1 spray into both nostrils daily.   Vitafol Ultra 29-0.6-0.4-200 MG Caps Take 1 capsule by mouth daily.        Donette Larry, CNM 03/04/2023, 7:42 PM

## 2023-03-04 NOTE — Progress Notes (Signed)
Pt present for ROB.  Reports irregular contractions and vaginal pressure since Sunday.  Endorses +FM. Reports trickling of fluid after urination. Unsure if it is urine.

## 2023-03-04 NOTE — Progress Notes (Signed)
Subjective:  Laurie Friedman is a 37 y.o. U9W1191 at [redacted]w[redacted]d being seen today for ongoing prenatal care.  She is currently monitored for the following issues for this high-risk pregnancy and has Benign tumor of breast; Supervision of other normal pregnancy, antepartum; History of cesarean section; Advanced maternal age in multigravida; and Fetal growth restriction antepartum on their problem list.  Patient reports backache.  Contractions: Irritability. Vag. Bleeding: None.  Movement: Present. Denies leaking of fluid.   The following portions of the patient's history were reviewed and updated as appropriate: allergies, current medications, past family history, past medical history, past social history, past surgical history and problem list. Problem list updated.  Objective:   Vitals:   03/04/23 1629  BP: 110/67  Pulse: 87    Fetal Status:     Movement: Present     General:  Alert, oriented and cooperative. Patient is in no acute distress.  Skin: Skin is warm and dry. No rash noted.   Cardiovascular: Normal heart rate noted  Respiratory: Normal respiratory effort, no problems with respiration noted  Abdomen: Soft, gravid, appropriate for gestational age. Pain/Pressure: Present     Pelvic:  Cervical exam deferred        Extremities: Normal range of motion.  Edema: None  Mental Status: Normal mood and affect. Normal behavior. Normal judgment and thought content.   Urinalysis:        [place] MFM OB FOLLOW UP (Accession [id number]) (Order [id number]) Imaging Date: 02/12/2023 Department: [facility] Released By: [facility]ie [facility] Authorizing: Laurie Dubois, MD   Exam Status  Status  Final [99]   PACS Intelerad Image Link   Show images for [place] MFM OB FOLLOW UP Study Result  Narrative & Impression  ----------------------------------------------------------------------  OBSTETRICS REPORT                       (Signed Final 02/12/2023 02:48  pm) ---------------------------------------------------------------------- Patient Info  ID #:       [id number]                          D.O.B.:  1986-03-18 (36 yrs)  Name:       Laurie Friedman                   Visit Date: 02/12/2023 12:37 pm ---------------------------------------------------------------------- Performed By  Attending:        Noralee Space MD        Ref. Address:     [facility]                                                             [facility]                                                             Healthcare  Performed By:     [facility]        [facility]  Referred By:      Laurie Fillers MD ---------------------------------------------------------------------- Orders  #  Description                           Code        Ordered By  1  [place] MFM OB FOLLOW UP                   [phone]    Laurie Friedman  2  [place] MFM UA CORD DOPPLER                76820.02    Laurie Friedman ----------------------------------------------------------------------  #  Order #                     Accession #                Episode #  1  [id number]                   [id number]                 [id number]  2  [id number]                   [id number]                 [id number] ---------------------------------------------------------------------- Indications  Maternal care for known or suspected poor      O36.5930  fetal growth, third trimester, not applicable or  unspecified IUGR  Advanced maternal age primigravida 91+,        O4.513  third trimester (36)  Uterine size-date discrepancy, third trimester O26.843  History of cesarean delivery, currently        O75.219  pregnant  Grand multiparity, antepartum                  O09.40  Neg AFP, Neg  Horizon  Anemia  [redacted] weeks gestation of pregnancy                Z3A.28  GTT wnl ---------------------------------------------------------------------- Fetal Evaluation  Num Of Fetuses:         1  Fetal Heart Rate(bpm):  136  Cardiac Activity:       Observed  Presentation:           Cephalic  Placenta:  Posterior  P. Cord Insertion:      Previously visualized  Amniotic Fluid  AFI FV:      Within normal limits  AFI Sum(cm)     %Tile       Largest Pocket(cm)  18.62           72          5.7  RUQ(cm)       RLQ(cm)       LUQ(cm)        LLQ(cm)  5.43          4.92          2.57           5.7 ---------------------------------------------------------------------- Biometry  BPD:      74.5  mm     G. Age:  29w 6d         85  %    CI:        76.58   %    70 - 86                                                          FL/HC:      17.9   %    18.8 - 20.6  HC:      269.7  mm     G. Age:  29w 3d         54  %    HC/AC:      1.18        1.05 - 1.21  AC:      228.4  mm     G. Age:  27w 2d         15  %    FL/BPD:     65.0   %    71 - 87  FL:       48.4  mm     G. Age:  26w 2d        2.1  %    FL/AC:      21.2   %    20 - 24  HUM:      44.7  mm     G. Age:  26w 4d          6  %  Est. FW:    1047  gm      2 lb 5 oz      9  % ---------------------------------------------------------------------- OB History  Gravidity:    7         Term:   5         SAB:   1  Living:       5 ---------------------------------------------------------------------- Gestational Age  LMP:           29w 4d        Date:  07/20/22                  EDD:   04/26/23  U/S Today:     28w 2d                                        EDD:   05/05/23  Best:  28w 2d     Det. ByMarcella Dubs         EDD:   05/05/23                                      (09/23/22) ---------------------------------------------------------------------- Anatomy  Cranium:               Appears normal         Aortic Arch:             Previously seen  Cavum:                 Appears normal         Ductal Arch:            Previously seen  Ventricles:            Previously seen        Diaphragm:              Appears normal  Choroid Plexus:        Previously seen        Stomach:                Appears normal, left                                                                        sided  Cerebellum:            Previously seen        Abdomen:                Previously seen  Posterior Fossa:       Previously seen        Abdominal Wall:         Previously seen  Nuchal Fold:           Previously seen        Cord Vessels:           Previously seen  Face:                  Orbits and profile     Kidneys:                Appear normal                         previously seen  Lips:                  Previously seen        Bladder:                Appears normal  Thoracic:              Previously seen        Spine:                  Previously seen  Heart:                 Previously seen        Upper Extremities:      Previously seen  RVOT:  Previously seen        Lower Extremities:      Previously seen  LVOT:                  Previously seen  Other:  Nasal bone, lenses, maxilla, mandible and falx,Heels/feet and open          hands/RT 5th digits previsualized.Fetus appears to be a female prev.          VC, 3V, 3VTV prev vis ---------------------------------------------------------------------- Doppler - Fetal Vessels  Umbilical Artery   S/D     %tile      RI    %tile      PI    %tile     PSV    ADFV    RDFV                                                     (cm/s)   3.02       51    0.67       56    1.08       67    43.08      No      No ---------------------------------------------------------------------- Impression  Patient returned for fetal growth assessment.  She does not  have gestational diabetes.  On today's ultrasound, the estimated fetal weight is at the 9th  percentile and the abdominal circumference  measurement is  at the 15th percentile.  Amniotic fluid is normal good fetal  activity seen.  Umbilical artery Doppler showed normal  forward diastolic flow.  I explained the finding of fetal growth restriction that is difficult  to appreciate from a constitutionally small fetus.  Patient's  pregravid BMI was 18.  Most common cause of fetal growth  restriction is placental insufficiency.  Will reassess fetal growth assessment in 3 weeks. ---------------------------------------------------------------------- Recommendations  -An appointment was made for her to return in 3 weeks for  fetal growth assessment and UA Doppler.Marland Kitchen ----------------------------------------------------------------------                 Laurie Space, MD Electronically Signed Final Report   02/12/2023 02:48 pm ----------------------------------------------------------------------      Assessment and Plan:  Pregnancy: Z6X0960 at [redacted]w[redacted]d  1. Supervision of high risk pregnancy in second trimester  2. Multigravida of advanced maternal age in third trimester  3. History of cesarean section  4. Fetal growth restriction antepartum - interval growth with UA Dopplers every 3 weeks  5. Request for sterilization - VBAC consented   There are no diagnoses linked to this encounter. Preterm labor symptoms and general obstetric precautions including but not limited to vaginal bleeding, contractions, leaking of fluid and fetal movement were reviewed in detail with the patient. Please refer to After Visit Summary for other counseling recommendations.   Return in about 2 weeks (around 03/18/2023) for ROB.   Brock Bad, MD 03/04/23

## 2023-03-04 NOTE — MAU Note (Signed)
.  Laurie Friedman is a 37 y.o. at [redacted]w[redacted]d here in MAU reporting: pain and pressure x 3 days, worsening today . In office today and sent here for evaluation. Reports positive fetal movement.  Onset of complaint: 3 days Pain score: 8/10 Vitals:   03/04/23 1737  BP: 104/63  Pulse: 80     FHT:127 Lab orders placed from triage:

## 2023-03-05 LAB — GC/CHLAMYDIA PROBE AMP (~~LOC~~) NOT AT ARMC
Chlamydia: NEGATIVE
Comment: NEGATIVE
Comment: NORMAL
Neisseria Gonorrhea: NEGATIVE

## 2023-03-06 ENCOUNTER — Ambulatory Visit: Payer: Medicaid Other | Admitting: *Deleted

## 2023-03-06 ENCOUNTER — Other Ambulatory Visit: Payer: Self-pay | Admitting: *Deleted

## 2023-03-06 ENCOUNTER — Ambulatory Visit: Payer: Medicaid Other | Attending: Obstetrics and Gynecology

## 2023-03-06 VITALS — BP 119/68 | HR 84

## 2023-03-06 DIAGNOSIS — O34219 Maternal care for unspecified type scar from previous cesarean delivery: Secondary | ICD-10-CM

## 2023-03-06 DIAGNOSIS — O365930 Maternal care for other known or suspected poor fetal growth, third trimester, not applicable or unspecified: Secondary | ICD-10-CM | POA: Diagnosis present

## 2023-03-06 DIAGNOSIS — O26843 Uterine size-date discrepancy, third trimester: Secondary | ICD-10-CM | POA: Diagnosis not present

## 2023-03-06 DIAGNOSIS — Z3A31 31 weeks gestation of pregnancy: Secondary | ICD-10-CM

## 2023-03-06 DIAGNOSIS — D649 Anemia, unspecified: Secondary | ICD-10-CM

## 2023-03-06 DIAGNOSIS — O99013 Anemia complicating pregnancy, third trimester: Secondary | ICD-10-CM

## 2023-03-06 DIAGNOSIS — O09523 Supervision of elderly multigravida, third trimester: Secondary | ICD-10-CM | POA: Diagnosis present

## 2023-03-06 DIAGNOSIS — O0943 Supervision of pregnancy with grand multiparity, third trimester: Secondary | ICD-10-CM | POA: Diagnosis not present

## 2023-03-10 ENCOUNTER — Ambulatory Visit (INDEPENDENT_AMBULATORY_CARE_PROVIDER_SITE_OTHER): Payer: Medicaid Other | Admitting: Obstetrics and Gynecology

## 2023-03-10 VITALS — BP 110/73 | HR 81 | Wt 153.0 lb

## 2023-03-10 DIAGNOSIS — Z3480 Encounter for supervision of other normal pregnancy, unspecified trimester: Secondary | ICD-10-CM

## 2023-03-10 DIAGNOSIS — Z3A32 32 weeks gestation of pregnancy: Secondary | ICD-10-CM

## 2023-03-10 DIAGNOSIS — O365990 Maternal care for other known or suspected poor fetal growth, unspecified trimester, not applicable or unspecified: Secondary | ICD-10-CM

## 2023-03-10 DIAGNOSIS — O09522 Supervision of elderly multigravida, second trimester: Secondary | ICD-10-CM

## 2023-03-10 DIAGNOSIS — Z98891 History of uterine scar from previous surgery: Secondary | ICD-10-CM

## 2023-03-10 NOTE — Progress Notes (Signed)
ROB 32 wks No unusual complaints MAU visit 4/24, UI FGR resolved per MFM/ follow up in 3 wks

## 2023-03-10 NOTE — Progress Notes (Signed)
   PRENATAL VISIT NOTE  Subjective:  Laurie Friedman is a 37 y.o. A5W0981 at [redacted]w[redacted]d being seen today for ongoing prenatal care.  She is currently monitored for the following issues for this high-risk pregnancy and has Benign tumor of breast; Supervision of other normal pregnancy, antepartum; History of cesarean section; Advanced maternal age in multigravida; Fetal growth restriction antepartum; and History of VBAC on their problem list.  Patient doing well with no acute concerns today. She reports no complaints.  Contractions: Not present. Vag. Bleeding: None.  Movement: Present. Denies leaking of fluid.   The following portions of the patient's history were reviewed and updated as appropriate: allergies, current medications, past family history, past medical history, past social history, past surgical history and problem list. Problem list updated.  Objective:   Vitals:   03/10/23 0944  BP: 110/73  Pulse: 81  Weight: 153 lb (69.4 kg)    Fetal Status: Fetal Heart Rate (bpm): 132 Fundal Height: 31 cm Movement: Present     General:  Alert, oriented and cooperative. Patient is in no acute distress.  Skin: Skin is warm and dry. No rash noted.   Cardiovascular: Normal heart rate noted  Respiratory: Normal respiratory effort, no problems with respiration noted  Abdomen: Soft, gravid, appropriate for gestational age.  Pain/Pressure: Absent     Pelvic: Cervical exam deferred        Extremities: Normal range of motion.  Edema: None  Mental Status:  Normal mood and affect. Normal behavior. Normal judgment and thought content.   Assessment and Plan:  Pregnancy: G7P5015 at [redacted]w[redacted]d  1. [redacted] weeks gestation of pregnancy   2. History of cesarean section Pt desires TOLAC  3. Fetal growth restriction antepartum EFW 15%, repeat growth in 3 weeks  4. Supervision of other normal pregnancy, antepartum Continue routine prenatal care  5. Multigravida of advanced maternal age in second  trimester   6. History of VBAC VBAC x 4,   Preterm labor symptoms and general obstetric precautions including but not limited to vaginal bleeding, contractions, leaking of fluid and fetal movement were reviewed in detail with the patient.  Please refer to After Visit Summary for other counseling recommendations.   Return in about 2 weeks (around 03/24/2023) for [facility], in person.   Mariel Aloe, MD Faculty Attending Center for [facility]

## 2023-03-24 ENCOUNTER — Encounter: Payer: Self-pay | Admitting: Obstetrics & Gynecology

## 2023-03-24 ENCOUNTER — Ambulatory Visit (INDEPENDENT_AMBULATORY_CARE_PROVIDER_SITE_OTHER): Payer: Medicaid Other | Admitting: Obstetrics & Gynecology

## 2023-03-24 VITALS — BP 113/73 | HR 89 | Wt 152.0 lb

## 2023-03-24 DIAGNOSIS — Z3480 Encounter for supervision of other normal pregnancy, unspecified trimester: Secondary | ICD-10-CM

## 2023-03-24 DIAGNOSIS — O09523 Supervision of elderly multigravida, third trimester: Secondary | ICD-10-CM

## 2023-03-24 DIAGNOSIS — Z98891 History of uterine scar from previous surgery: Secondary | ICD-10-CM

## 2023-03-24 DIAGNOSIS — Z3A34 34 weeks gestation of pregnancy: Secondary | ICD-10-CM

## 2023-03-24 NOTE — Progress Notes (Signed)
   PRENATAL VISIT NOTE  Subjective:  Laurie Friedman is a 37 y.o. Z6X0960 at [redacted]w[redacted]d being seen today for ongoing prenatal care.  She is currently monitored for the following issues for this low-risk pregnancy and has Supervision of other normal pregnancy, antepartum; History of cesarean section; Advanced maternal age in multigravida; and History of VBAC x 4 on their problem list.  Patient reports no complaints.  Contractions: Not present. Vag. Bleeding: None.  Movement: Present. Denies leaking of fluid.   The following portions of the patient's history were reviewed and updated as appropriate: allergies, current medications, past family history, past medical history, past social history, past surgical history and problem list.   Objective:   Vitals:   03/24/23 0842  BP: 113/73  Pulse: 89  Weight: 152 lb (68.9 kg)    Fetal Status: Fetal Heart Rate (bpm): 135   Movement: Present     General:  Alert, oriented and cooperative. Patient is in no acute distress.  Skin: Skin is warm and dry. No rash noted.   Cardiovascular: Normal heart rate noted  Respiratory: Normal respiratory effort, no problems with respiration noted  Abdomen: Soft, gravid, appropriate for gestational age.  Pain/Pressure: Present     Pelvic: Cervical exam deferred        Extremities: Normal range of motion.     Mental Status: Normal mood and affect. Normal behavior. Normal judgment and thought content.   Assessment and Plan:  Pregnancy: A5W0981 at [redacted]w[redacted]d 1. History of VBAC x 4 TOLAC consent already signed.  2. [redacted] weeks gestation of pregnancy 3. Multigravida of advanced maternal age in third trimester 4. Supervision of other normal pregnancy, antepartum No other concerns. Follow up growth scan on 03/31/23 (resolved FGR). Preterm labor symptoms and general obstetric precautions including but not limited to vaginal bleeding, contractions, leaking of fluid and fetal movement were reviewed in detail with the  patient. Please refer to After Visit Summary for other counseling recommendations.   Return in 2 weeks (on 04/07/2023) for Pelvic cultures, OFFICE OB VISIT (MD or APP).  Future Appointments  Date Time Provider Department Center  03/31/2023  8:30 AM [facility] NURSE [facility]  03/31/2023  8:45 AM [facility] US6 [facility]    Jaynie Collins, MD

## 2023-03-24 NOTE — Patient Instructions (Signed)
Return to office for any scheduled appointments. Call the office or go to the MAU at [facility] [place] if: You begin to have strong, frequent contractions Your water breaks.  Sometimes it is a big gush of fluid, sometimes it is just a trickle that keeps getting your underwear wet or running down your legs You have vaginal bleeding.  It is normal to have a small amount of spotting if your cervix was checked.  You do not feel your baby moving like normal.  If you do not, get something to eat and drink and lay down and focus on feeling your baby move.   If your baby is still not moving like normal, you should call the office or go to MAU. Any other obstetric concerns.  

## 2023-03-31 ENCOUNTER — Ambulatory Visit: Payer: Medicaid Other

## 2023-04-10 ENCOUNTER — Ambulatory Visit (INDEPENDENT_AMBULATORY_CARE_PROVIDER_SITE_OTHER): Payer: Medicaid Other | Admitting: Family Medicine

## 2023-04-10 ENCOUNTER — Other Ambulatory Visit (HOSPITAL_COMMUNITY)
Admission: RE | Admit: 2023-04-10 | Discharge: 2023-04-10 | Disposition: A | Discharge status: Home / Self Care | Payer: Medicaid Other | Source: Ambulatory Visit | Attending: Family Medicine | Admitting: Family Medicine

## 2023-04-10 ENCOUNTER — Encounter: Payer: Self-pay | Admitting: Family Medicine

## 2023-04-10 VITALS — BP 108/63 | HR 87 | Wt 151.6 lb

## 2023-04-10 DIAGNOSIS — Z98891 History of uterine scar from previous surgery: Secondary | ICD-10-CM

## 2023-04-10 DIAGNOSIS — Z3480 Encounter for supervision of other normal pregnancy, unspecified trimester: Secondary | ICD-10-CM

## 2023-04-10 DIAGNOSIS — O09523 Supervision of elderly multigravida, third trimester: Secondary | ICD-10-CM

## 2023-04-10 NOTE — Progress Notes (Signed)
Pt presents for ROB visit. Pt c/o pain and pressure.

## 2023-04-10 NOTE — Progress Notes (Signed)
   PRENATAL VISIT NOTE  Subjective:  Laurie Friedman is a 37 y.o. Z6X0960 at [redacted]w[redacted]d being seen today for ongoing prenatal care.  She is currently monitored for the following issues for this high-risk pregnancy and has Supervision of other normal pregnancy, antepartum; History of cesarean section; Advanced maternal age in multigravida; and History of VBAC x 4 on their problem list.  Patient reports no complaints.  Contractions: Irritability. Vag. Bleeding: None.  Movement: Present. Denies leaking of fluid.   The following portions of the patient's history were reviewed and updated as appropriate: allergies, current medications, past family history, past medical history, past social history, past surgical history and problem list.   Objective:   Vitals:   04/10/23 1124  BP: 108/63  Pulse: 87  Weight: 151 lb 9.6 oz (68.8 kg)    Fetal Status: Fetal Heart Rate (bpm): 131   Movement: Present     General:  Alert, oriented and cooperative. Patient is in no acute distress.  Skin: Skin is warm and dry. No rash noted.   Cardiovascular: Normal heart rate noted  Respiratory: Normal respiratory effort, no problems with respiration noted  Abdomen: Soft, gravid, appropriate for gestational age.  Pain/Pressure: Present     Pelvic: Cervical exam deferred        Extremities: Normal range of motion.  Edema: Trace  Mental Status: Normal mood and affect. Normal behavior. Normal judgment and thought content.   Assessment and Plan:  Pregnancy: A5W0981 at [redacted]w[redacted]d  1. Supervision of other normal pregnancy, antepartum - Culture, beta strep (group b only) - Cervicovaginal ancillary only( [place])   Preterm labor symptoms and general obstetric precautions including but not limited to vaginal bleeding, contractions, leaking of fluid and fetal movement were reviewed in detail with the patient. Please refer to After Visit Summary for other counseling recommendations.    Future Appointments  Date Time  Provider Department Center  04/16/2023  9:35 AM Nobie Putnam, Cyndi Lennert, MD [facility] None  04/23/2023  9:55 AM Adam Phenix, MD [facility] None  04/23/2023  3:30 PM [facility] NURSE [facility]  04/23/2023  3:45 PM [facility] US4 [facility]  05/01/2023  9:55 AM Lennart Pall, MD [facility] None  05/08/2023 11:15 AM Marny Lowenstein, PA-C [facility] None    Myrtie Hawk, DO FMOB Fellow, Faculty practice [facility], Center for [facility] 04/10/23  11:59 AM

## 2023-04-13 LAB — CERVICOVAGINAL ANCILLARY ONLY
Bacterial Vaginitis (gardnerella): NEGATIVE
Candida Glabrata: NEGATIVE
Candida Vaginitis: NEGATIVE
Chlamydia: NEGATIVE
Comment: NEGATIVE
Comment: NEGATIVE
Comment: NEGATIVE
Comment: NEGATIVE
Comment: NEGATIVE
Comment: NORMAL
Neisseria Gonorrhea: NEGATIVE
Trichomonas: NEGATIVE

## 2023-04-14 LAB — CULTURE, BETA STREP (GROUP B ONLY): Strep Gp B Culture: NEGATIVE

## 2023-04-16 ENCOUNTER — Ambulatory Visit (INDEPENDENT_AMBULATORY_CARE_PROVIDER_SITE_OTHER): Payer: Medicaid Other | Admitting: Family Medicine

## 2023-04-16 VITALS — BP 112/69 | HR 82 | Wt 153.6 lb

## 2023-04-16 DIAGNOSIS — Z98891 History of uterine scar from previous surgery: Secondary | ICD-10-CM

## 2023-04-16 DIAGNOSIS — Z3480 Encounter for supervision of other normal pregnancy, unspecified trimester: Secondary | ICD-10-CM

## 2023-04-16 DIAGNOSIS — O365990 Maternal care for other known or suspected poor fetal growth, unspecified trimester, not applicable or unspecified: Secondary | ICD-10-CM

## 2023-04-16 DIAGNOSIS — O09523 Supervision of elderly multigravida, third trimester: Secondary | ICD-10-CM

## 2023-04-16 DIAGNOSIS — Z3A37 37 weeks gestation of pregnancy: Secondary | ICD-10-CM

## 2023-04-16 NOTE — Progress Notes (Signed)
   PRENATAL VISIT NOTE  Subjective:  Laurie Friedman is a 37 y.o. Z6X0960 at [redacted]w[redacted]d being seen today for ongoing prenatal care.  She is currently monitored for the following issues for this low-risk pregnancy and has Supervision of other normal pregnancy, antepartum; History of cesarean section; Advanced maternal age in multigravida; and History of VBAC x 4 on their problem list.  Patient reports no cramping, no leaking, occasional contractions, and vaginal pressure  .  Contractions: Irregular. Vag. Bleeding: None.  Movement: Present. Denies leaking of fluid.   The following portions of the patient's history were reviewed and updated as appropriate: allergies, current medications, past family history, past medical history, past social history, past surgical history and problem list.   Objective:   Vitals:   04/16/23 0938  BP: 112/69  Pulse: 82  Weight: 153 lb 9.6 oz (69.7 kg)    Fetal Status: Fetal Heart Rate (bpm): 135   Movement: Present     General:  Alert, oriented and cooperative. Patient is in no acute distress.  Skin: Skin is warm and dry. No rash noted.   Cardiovascular: Normal heart rate noted  Respiratory: Normal respiratory effort, no problems with respiration noted  Abdomen: Soft, gravid, appropriate for gestational age.  Pain/Pressure: Present     Pelvic: Cervical exam performed in the presence of a chaperone      1/80/0  Extremities: Normal range of motion.  Edema: None  Mental Status: Normal mood and affect. Normal behavior. Normal judgment and thought content.   Assessment and Plan:  Pregnancy: A5W0981 at [redacted]w[redacted]d 1. Supervision of other normal pregnancy, antepartum Continue routine prenatal care  2. History of VBAC x 4 TOLAC papers signed previously  3. History of cesarean section  4. Multigravida of advanced maternal age in third trimester  5. Fetal growth restriction antepartum Scheduled for repeat growth ultrasound on 6/13.  Will determine if induction needed  at that time  6. [redacted] weeks gestation of pregnancy Patient having considerable pelvic pressure.  No regular contractions.  Cervical exam 1/80/0.  Discussed strict MAU precautions.  Term labor symptoms and general obstetric precautions including but not limited to vaginal bleeding, contractions, leaking of fluid and fetal movement were reviewed in detail with the patient. Please refer to After Visit Summary for other counseling recommendations.   No follow-ups on file.  Future Appointments  Date Time Provider Department Center  04/23/2023  9:55 AM Adam Phenix, MD [facility] None  04/23/2023  3:30 PM [facility] NURSE [facility]  04/23/2023  3:45 PM [facility] US4 [facility]  05/01/2023  9:55 AM Lennart Pall, MD [facility] None  05/08/2023 11:15 AM Marny Lowenstein, PA-C [facility] None    Celedonio Savage, MD

## 2023-04-23 ENCOUNTER — Encounter (HOSPITAL_COMMUNITY): Payer: Self-pay | Admitting: *Deleted

## 2023-04-23 ENCOUNTER — Ambulatory Visit (INDEPENDENT_AMBULATORY_CARE_PROVIDER_SITE_OTHER): Payer: Medicaid Other | Admitting: Obstetrics & Gynecology

## 2023-04-23 ENCOUNTER — Ambulatory Visit: Payer: Medicaid Other | Admitting: *Deleted

## 2023-04-23 ENCOUNTER — Ambulatory Visit (HOSPITAL_BASED_OUTPATIENT_CLINIC_OR_DEPARTMENT_OTHER): Payer: Medicaid Other

## 2023-04-23 ENCOUNTER — Encounter: Payer: Self-pay | Admitting: Obstetrics & Gynecology

## 2023-04-23 ENCOUNTER — Other Ambulatory Visit: Payer: Self-pay | Admitting: Maternal & Fetal Medicine

## 2023-04-23 ENCOUNTER — Telehealth (HOSPITAL_COMMUNITY): Payer: Self-pay | Admitting: *Deleted

## 2023-04-23 VITALS — BP 110/63 | HR 78

## 2023-04-23 VITALS — BP 112/74 | HR 72 | Wt 153.0 lb

## 2023-04-23 DIAGNOSIS — O0943 Supervision of pregnancy with grand multiparity, third trimester: Secondary | ICD-10-CM

## 2023-04-23 DIAGNOSIS — O365930 Maternal care for other known or suspected poor fetal growth, third trimester, not applicable or unspecified: Secondary | ICD-10-CM

## 2023-04-23 DIAGNOSIS — O26843 Uterine size-date discrepancy, third trimester: Secondary | ICD-10-CM | POA: Diagnosis not present

## 2023-04-23 DIAGNOSIS — Z3A38 38 weeks gestation of pregnancy: Secondary | ICD-10-CM

## 2023-04-23 DIAGNOSIS — O09523 Supervision of elderly multigravida, third trimester: Secondary | ICD-10-CM

## 2023-04-23 DIAGNOSIS — Z3480 Encounter for supervision of other normal pregnancy, unspecified trimester: Secondary | ICD-10-CM

## 2023-04-23 DIAGNOSIS — O34219 Maternal care for unspecified type scar from previous cesarean delivery: Secondary | ICD-10-CM

## 2023-04-23 DIAGNOSIS — Z98891 History of uterine scar from previous surgery: Secondary | ICD-10-CM

## 2023-04-23 NOTE — Progress Notes (Signed)
   PRENATAL VISIT NOTE  Subjective:  Laurie Friedman is a 37 y.o. Z6X0960 at [redacted]w[redacted]d being seen today for ongoing prenatal care.  She is currently monitored for the following issues for this high-risk pregnancy and has Supervision of other normal pregnancy, antepartum; History of cesarean section; Advanced maternal age in multigravida; and History of VBAC x 4 on their problem list.  Patient reports occasional contractions and lots of pressure and discomforts .  Contractions: Irregular. Vag. Bleeding: None.  Movement: Present. Denies leaking of fluid.   The following portions of the patient's history were reviewed and updated as appropriate: allergies, current medications, past family history, past medical history, past social history, past surgical history and problem list.   Objective:   Vitals:   04/23/23 0959  BP: 112/74  Pulse: 72  Weight: 153 lb (69.4 kg)    Fetal Status: Fetal Heart Rate (bpm): 125   Movement: Present     General:  Alert, oriented and cooperative. Patient is in no acute distress.  Skin: Skin is warm and dry. No rash noted.   Cardiovascular: Normal heart rate noted  Respiratory: Normal respiratory effort, no problems with respiration noted  Abdomen: Soft, gravid, appropriate for gestational age.  Pain/Pressure: Present     Pelvic: Cervical exam performed in the presence of a chaperone Dilation: 1.5 (posterior) Effacement (%): 30 Station: -2  Extremities: Normal range of motion.     Mental Status: Normal mood and affect. Normal behavior. Normal judgment and thought content.   Assessment and Plan:  Pregnancy: A5W0981 at [redacted]w[redacted]d 1. Multigravida of advanced maternal age in third trimester Discomforts of pregnancy  2. Supervision of other normal pregnancy, antepartum Growth [place] tomorrow  3. History of cesarean section TOLAC  4. History of VBAC x 4   Term labor symptoms and general obstetric precautions including but not limited to vaginal bleeding, contractions,  leaking of fluid and fetal movement were reviewed in detail with the patient. Please refer to After Visit Summary for other counseling recommendations.   Return if symptoms worsen or fail to improve, for postpartum.  Future Appointments  Date Time Provider Department Center  04/23/2023  3:30 PM [facility] NURSE [facility]  04/23/2023  3:45 PM [facility] US4 [facility]  05/01/2023  9:55 AM Lennart Pall, MD [facility] None  05/08/2023 11:15 AM Marny Lowenstein, PA-C [facility] None    Scheryl Darter, MD

## 2023-04-23 NOTE — Progress Notes (Signed)
Pt states she is having increase in pelvic pressure, making it hard to walk/stand.

## 2023-04-23 NOTE — Telephone Encounter (Signed)
Preadmission screen  

## 2023-04-24 ENCOUNTER — Inpatient Hospital Stay (HOSPITAL_COMMUNITY)
Admission: RE | Admit: 2023-04-24 | Discharge: 2023-04-26 | DRG: 807 | Disposition: A | Discharge status: Home / Self Care | Payer: Medicaid Other | Attending: Obstetrics and Gynecology | Admitting: Obstetrics and Gynecology

## 2023-04-24 ENCOUNTER — Encounter (HOSPITAL_COMMUNITY): Payer: Self-pay | Admitting: Obstetrics & Gynecology

## 2023-04-24 ENCOUNTER — Inpatient Hospital Stay (HOSPITAL_COMMUNITY): Payer: Medicaid Other

## 2023-04-24 ENCOUNTER — Other Ambulatory Visit: Payer: Self-pay

## 2023-04-24 ENCOUNTER — Inpatient Hospital Stay (HOSPITAL_COMMUNITY): Payer: Medicaid Other | Admitting: Anesthesiology

## 2023-04-24 DIAGNOSIS — O34219 Maternal care for unspecified type scar from previous cesarean delivery: Secondary | ICD-10-CM | POA: Diagnosis present

## 2023-04-24 DIAGNOSIS — Z98891 History of uterine scar from previous surgery: Secondary | ICD-10-CM

## 2023-04-24 DIAGNOSIS — Z3A38 38 weeks gestation of pregnancy: Secondary | ICD-10-CM

## 2023-04-24 DIAGNOSIS — O99334 Smoking (tobacco) complicating childbirth: Secondary | ICD-10-CM | POA: Diagnosis present

## 2023-04-24 DIAGNOSIS — Z7982 Long term (current) use of aspirin: Secondary | ICD-10-CM | POA: Diagnosis not present

## 2023-04-24 DIAGNOSIS — O09523 Supervision of elderly multigravida, third trimester: Secondary | ICD-10-CM | POA: Diagnosis not present

## 2023-04-24 DIAGNOSIS — O365990 Maternal care for other known or suspected poor fetal growth, unspecified trimester, not applicable or unspecified: Secondary | ICD-10-CM | POA: Diagnosis present

## 2023-04-24 DIAGNOSIS — O34211 Maternal care for low transverse scar from previous cesarean delivery: Secondary | ICD-10-CM | POA: Diagnosis not present

## 2023-04-24 DIAGNOSIS — O09529 Supervision of elderly multigravida, unspecified trimester: Secondary | ICD-10-CM

## 2023-04-24 DIAGNOSIS — F17210 Nicotine dependence, cigarettes, uncomplicated: Secondary | ICD-10-CM | POA: Diagnosis present

## 2023-04-24 DIAGNOSIS — Z3480 Encounter for supervision of other normal pregnancy, unspecified trimester: Secondary | ICD-10-CM

## 2023-04-24 DIAGNOSIS — O365930 Maternal care for other known or suspected poor fetal growth, third trimester, not applicable or unspecified: Secondary | ICD-10-CM | POA: Diagnosis present

## 2023-04-24 DIAGNOSIS — Z30430 Encounter for insertion of intrauterine contraceptive device: Secondary | ICD-10-CM | POA: Diagnosis not present

## 2023-04-24 DIAGNOSIS — Z975 Presence of (intrauterine) contraceptive device: Secondary | ICD-10-CM

## 2023-04-24 LAB — CBC
HCT: 32.2 % — ABNORMAL LOW (ref 36.0–46.0)
Hemoglobin: 10.6 g/dL — ABNORMAL LOW (ref 12.0–15.0)
MCH: 30.6 pg (ref 26.0–34.0)
MCHC: 32.9 g/dL (ref 30.0–36.0)
MCV: 93.1 fL (ref 80.0–100.0)
Platelets: 199 10*3/uL (ref 150–400)
RBC: 3.46 MIL/uL — ABNORMAL LOW (ref 3.87–5.11)
RDW: 12.7 % (ref 11.5–15.5)
WBC: 9.5 10*3/uL (ref 4.0–10.5)
nRBC: 0 % (ref 0.0–0.2)

## 2023-04-24 LAB — TYPE AND SCREEN
ABO/RH(D): O POS
Antibody Screen: NEGATIVE

## 2023-04-24 MED ORDER — LACTATED RINGERS IV SOLN: INTRAVENOUS | Status: DC

## 2023-04-24 MED ORDER — OXYTOCIN-SODIUM CHLORIDE 30-0.9 UT/500ML-% IV SOLN
1.0000 m[IU]/min | INTRAVENOUS | Status: DC
  Administered 2023-04-24: 2 m[IU]/min via INTRAVENOUS

## 2023-04-24 MED ORDER — EPHEDRINE 5 MG/ML INJ: 10.0000 mg | INTRAVENOUS | Status: DC | PRN

## 2023-04-24 MED ORDER — ACETAMINOPHEN 325 MG PO TABS
650.0000 mg | ORAL_TABLET | ORAL | Status: DC | PRN
  Administered 2023-04-24 – 2023-04-25 (×2): 650 mg via ORAL
  Filled 2023-04-24 (×2): qty 2

## 2023-04-24 MED ORDER — SOD CITRATE-CITRIC ACID 500-334 MG/5ML PO SOLN: 30.0000 mL | ORAL | Status: DC | PRN

## 2023-04-24 MED ORDER — OXYCODONE-ACETAMINOPHEN 5-325 MG PO TABS: 1.0000 | ORAL_TABLET | ORAL | Status: DC | PRN

## 2023-04-24 MED ORDER — LIDOCAINE HCL (PF) 1 % IJ SOLN: 30.0000 mL | INTRAMUSCULAR | Status: DC | PRN

## 2023-04-24 MED ORDER — OXYCODONE-ACETAMINOPHEN 5-325 MG PO TABS: 2.0000 | ORAL_TABLET | ORAL | Status: DC | PRN

## 2023-04-24 MED ORDER — PHENYLEPHRINE 80 MCG/ML (10ML) SYRINGE FOR IV PUSH (FOR BLOOD PRESSURE SUPPORT): 80.0000 ug | PREFILLED_SYRINGE | INTRAVENOUS | Status: DC | PRN

## 2023-04-24 MED ORDER — ONDANSETRON HCL 4 MG/2ML IJ SOLN: 4.0000 mg | Freq: Four times a day (QID) | INTRAMUSCULAR | Status: DC | PRN

## 2023-04-24 MED ORDER — LACTATED RINGERS AMNIOINFUSION: INTRAVENOUS | Status: DC

## 2023-04-24 MED ORDER — FENTANYL-BUPIVACAINE-NACL 0.5-0.125-0.9 MG/250ML-% EP SOLN
12.0000 mL/h | EPIDURAL | Status: DC | PRN
  Administered 2023-04-24: 12 mL/h via EPIDURAL
  Filled 2023-04-24: qty 250

## 2023-04-24 MED ORDER — OXYTOCIN-SODIUM CHLORIDE 30-0.9 UT/500ML-% IV SOLN
2.5000 [IU]/h | INTRAVENOUS | Status: DC
  Filled 2023-04-24: qty 500

## 2023-04-24 MED ORDER — OXYTOCIN BOLUS FROM INFUSION
333.0000 mL | Freq: Once | INTRAVENOUS | Status: AC
  Administered 2023-04-25: 333 mL via INTRAVENOUS

## 2023-04-24 MED ORDER — TERBUTALINE SULFATE 1 MG/ML IJ SOLN: 0.2500 mg | Freq: Once | INTRAMUSCULAR | Status: DC | PRN

## 2023-04-24 MED ORDER — FENTANYL CITRATE (PF) 100 MCG/2ML IJ SOLN
100.0000 ug | Freq: Once | INTRAMUSCULAR | Status: AC
  Administered 2023-04-24: 100 ug via INTRAVENOUS
  Filled 2023-04-24: qty 2

## 2023-04-24 MED ORDER — LACTATED RINGERS IV SOLN: 500.0000 mL | Freq: Once | INTRAVENOUS | Status: DC

## 2023-04-24 MED ORDER — LIDOCAINE HCL (PF) 1 % IJ SOLN
INTRAMUSCULAR | Status: DC | PRN
  Administered 2023-04-24: 3 mL via EPIDURAL
  Administered 2023-04-24: 5 mL via EPIDURAL

## 2023-04-24 MED ORDER — LACTATED RINGERS IV SOLN
500.0000 mL | INTRAVENOUS | Status: DC | PRN
  Administered 2023-04-24: 500 mL via INTRAVENOUS

## 2023-04-24 MED ORDER — DIPHENHYDRAMINE HCL 50 MG/ML IJ SOLN: 12.5000 mg | INTRAMUSCULAR | Status: DC | PRN

## 2023-04-24 MED ORDER — LEVONORGESTREL 20 MCG/DAY IU IUD
1.0000 | INTRAUTERINE_SYSTEM | Freq: Once | INTRAUTERINE | Status: AC
  Administered 2023-04-25: 1 via INTRAUTERINE
  Filled 2023-04-24: qty 1

## 2023-04-24 MED ORDER — TERBUTALINE SULFATE 1 MG/ML IJ SOLN
0.2500 mg | Freq: Once | INTRAMUSCULAR | Status: DC | PRN
  Filled 2023-04-24: qty 1

## 2023-04-24 NOTE — Anesthesia Procedure Notes (Signed)
Epidural Patient location during procedure: OB Start time: 04/24/2023 5:22 PM End time: 04/24/2023 5:27 PM  Staffing Anesthesiologist: Linton Rump, MD Performed: anesthesiologist   Preanesthetic Checklist Completed: patient identified, IV checked, site marked, risks and benefits discussed, surgical consent, monitors and equipment checked, pre-op evaluation and timeout performed  Epidural Patient position: sitting Prep: DuraPrep and site prepped and draped Patient monitoring: continuous pulse ox and blood pressure Approach: midline Location: L3-L4 Injection technique: LOR saline  Needle:  Needle type: Tuohy  Needle gauge: 17 G Needle length: 9 cm and 9 Needle insertion depth: 4 cm Catheter type: closed end flexible Catheter size: 19 Gauge Catheter at skin depth: 8 cm Test dose: negative  Assessment Events: blood not aspirated, no cerebrospinal fluid, injection not painful, no injection resistance, no paresthesia and negative IV test  Additional Notes The patient has requested an epidural for labor pain management. Risks and benefits including, but not limited to, infection, bleeding, local anesthetic toxicity, headache, hypotension, back pain, block failure, etc. were discussed with the patient. The patient expressed understanding and consented to the procedure. I confirmed that the patient has no bleeding disorders and is not taking blood thinners. I confirmed the patient's last platelet count with the nurse. A time-out was performed immediately prior to the procedure. Please see nursing documentation for vital signs. Sterile technique was used throughout the whole procedure. Once LOR achieved, the epidural catheter threaded easily without resistance. Aspiration of the catheter was negative for blood and CSF. The epidural was dosed slowly and an infusion was started.  1 attempt(s)Reason for block:procedure for pain

## 2023-04-24 NOTE — Progress Notes (Signed)
Labor Progress Note Laurie Friedman is a 37 y.o. [id number] at [redacted]w[redacted]d presented for IOL 2/2 FGR (AC 5%).  S: called to bedside by RN due to new variable decels, which are deep. Patient also feeling more pressure with contractions. Evaluated at bedside. Patient has an epidural in place.  O:  BP 107/61   Pulse (!) 56   Temp 97.8 F (36.6 C) (Oral)   Resp 16   LMP 07/20/2022 (Approximate)   SpO2 100%    CVE: Dilation: 4.5 Effacement (%): 90 Cervical Position: Posterior Station: Plus 2 Presentation: Vertex Exam by:: Dr. Ladon Applebaum   A&P: 37 y.o. [id number] [redacted]w[redacted]d here for IOL TOLAC 2/2 FGR (AC 5%).  #Labor: s/p FB, AROM. On pitocin at 6 units. Recurrent variable decels noted with nadir in the 70s. Pitocin dc'd, IVF bolus started and then placed on hands and knees due to persistence of decels.  Decels noted to improve significantly. FSE and IUPC placed.  Will continue to monitor. Plan to re-eval in about 1 hr. #Pain: Epidural #FWB: Cat I #GBS negative  Sheppard Evens MD MPH OB Fellow, Faculty Practice [facility], Center for [facility] 04/24/2023

## 2023-04-24 NOTE — H&P (Addendum)
OBSTETRIC ADMISSION HISTORY AND PHYSICAL  Laurie Friedman is a 37 y.o. female [phone] with IUP at [redacted]w[redacted]d by ultrasound presenting for IOL 2/2 FGR (AC 5%). She reports +FMs, No LOF, no VB, no blurry vision, headaches or peripheral edema, and RUQ pain.  She plans on breast and bottle feeding. She request post placental IUD for birth control. She received her [facility]  Dating: By ultrasound --->  Estimated Date of Delivery: 05/05/23  Sono:    @[redacted]w[redacted]d , CWD, normal anatomy, cephalic presentation, BPP 8/8, 2824g, 13% EFW, AC 5%.   Prenatal History/Complications:  Hx of VBAC x4 FGR  Past Medical History: Past Medical History:  Diagnosis Date   Anemia    Anxiety    Benign tumor of breast 09/23/2022   Fibroadenoma of breast    UTI (urinary tract infection)     Past Surgical History: Past Surgical History:  Procedure Laterality Date   BREAST LUMPECTOMY Left 2010   BREAST LUMPECTOMY Left 05/01/2022   Procedure: LEFT BREAST LUMPECTOMY;  Surgeon: Harriette Bouillon, MD;  Location: [facility];  Service: General;  Laterality: Left;   BREAST LUMPECTOMY Bilateral 2014   CESAREAN SECTION  2008    Obstetrical History: OB History     Gravida  7   Para  5   Term  5   Preterm      AB  1   Living  5      SAB  1   IAB      Ectopic      Multiple      Live Births  5        Obstetric Comments  Pt was pushing, fh was dropping- ended up with c/s 2013 baby was face up, had problems getting the shoulders out, was bleeding, got a shot         Social History Social History   Socioeconomic History   Marital status: Married    Spouse name: Not on file   Number of children: Not on file   Years of education: Not on file   Highest education level: Not on file  Occupational History   Not on file  Tobacco Use   Smoking status: Every Day    Packs/day: 1.00    Years: 8.00    Additional pack years: 0.00    Total pack years: 8.00    Types: Cigarettes     Last attempt to quit: 09/2022    Years since quitting: 0.6   Smokeless tobacco: Never  Vaping Use   Vaping Use: Never used  Substance and Sexual Activity   Alcohol use: No   Drug use: No   Sexual activity: Yes    Partners: Male    Birth control/protection: I.U.D.    Comment: 2 weeks ago  Other Topics Concern   Not on file  Social History Narrative   Not on file   Social Determinants of Health   Financial Resource Strain: Not on file  Food Insecurity: Not on file  Transportation Needs: Not on file  Physical Activity: Not on file  Stress: Not on file  Social Connections: Not on file    Family History: Family History  Problem Relation Age of Onset   Healthy Mother    Diabetes Maternal Grandmother    Breast cancer Neg Hx     Allergies: No Known Allergies  Medications Prior to Admission  Medication Sig Dispense Refill Last Dose   aspirin EC 81 MG tablet Take 1  tablet (81 mg total) by mouth daily. Swallow whole. 30 tablet 12 Past Week   Blood Pressure Monitoring (BLOOD PRESSURE KIT) DEVI 1 kit by Does not apply route once a week. 1 each 0 Past Week   Prenat-Fe Poly-Methfol-FA-DHA (VITAFOL ULTRA) 29-0.6-0.4-200 MG CAPS Take 1 capsule by mouth daily. 30 capsule 11 04/23/2023     Review of Systems   All systems reviewed and negative except as stated in HPI  Blood pressure 109/66, pulse 89, temperature 98.2 F (36.8 C), temperature source Oral, resp. rate 16, last menstrual period 07/20/2022, SpO2 100 %. General appearance: alert, cooperative, and no distress Lungs: Normal work of breathing on room air Pelvic: To be completed Extremities: No sign of DVT Presentation: cephalic Fetal monitoringBaseline: 140 bpm, Variability: mod, Accelerations: Reactive, and Decelerations: Absent Uterine activity irregular     Prenatal labs: ABO, Rh: O/Positive/-- (01/09 0935) Antibody: Negative (01/09 0935) Rubella: 4.21 (01/09 0935) RPR: Non Reactive (04/02 1014)  HBsAg:  Negative (01/09 0935)  HIV: Non Reactive (04/02 1014)  GBS: Negative/-- (05/31 1210)  2 hr Glucola normal Genetic screening  LR NIPS Anatomy US: Normal anatomy, FGR  Prenatal Transfer Tool  Maternal Diabetes: No Genetic Screening: Normal Maternal Ultrasounds/Referrals: IUGR Fetal Ultrasounds or other Referrals:  None Maternal Substance Abuse:  No Significant Maternal Medications:  None Significant Maternal Lab Results:  Group B Strep negative Number of Prenatal Visits:greater than 3 verified prenatal visits Other Comments:  None  No results found for this or any previous visit (from the past 24 hour(s)).  Patient Active Problem List   Diagnosis Date Noted   Indication for care in labor or delivery 04/24/2023   History of VBAC x 4 03/10/2023   Fetal growth restriction antepartum 02/24/2023   History of cesarean section 12/10/2022   Advanced maternal age in multigravida 12/10/2022   Supervision of other normal pregnancy, antepartum 11/06/2022    Assessment/Plan:  Laurie Friedman is a 37 y.o. [id number] at [redacted]w[redacted]d here for IOL 2/2 FGR (AC 5%). Hx of VBAC x4, avoid cytotec.  #Labor: Plan for low dose pitocin. Foley catheter when appropriate. #Pain: May request epidural #FWB: Cat I #ID:  GBS Negative #MOF: Both #MOC:Post placental IUD #Circ:  Yes  Tiffany Kocher, DO  04/24/2023, 12:26 PM  Fellow Attestation  I saw and evaluated the patient, performing the key elements of the service.I  personally performed or re-performed the history, physical exam, and medical decision making activities of this service and have verified that the service and findings are accurately documented in the resident's note. I developed the management plan that is described in the resident's note, and I agree with the content, with my edits above.    Derrel Nip, MD OB Fellow

## 2023-04-24 NOTE — Progress Notes (Signed)
Labor Progress Note Laurie Friedman is a 37 y.o. [id number] at [redacted]w[redacted]d presented for IOL 2/2 FGR (AC 5%).  S: Doing well, no concerns. Discussed AROM, patient agreeable. Discussed post placenta IUD, patient agreeable and elects for Mirena.   O:  BP 103/64   Pulse 65   Temp 97.6 F (36.4 C) (Oral)   Resp 16   LMP 07/20/2022 (Approximate)   SpO2 100%    CVE: Dilation: 4 Effacement (%): 80 Station: 0 Presentation: Vertex Exam by:: Dr. Nobie Putnam   A&P: 37 y.o. [id number] [redacted]w[redacted]d here for IOL 2/2 FGR (AC 5%).  #Labor: AROM @ 1816. Continue current pitocin. #Pain: Epidural #FWB: Cat I #GBS negative   Tiffany Kocher, DO 6:21 PM

## 2023-04-24 NOTE — Anesthesia Preprocedure Evaluation (Signed)
Anesthesia Evaluation  Patient identified by MRN, date of birth, ID band Patient awake    Reviewed: Allergy & Precautions, NPO status , Patient's Chart, lab work & pertinent test results  History of Anesthesia Complications Negative for: history of anesthetic complications  Airway Mallampati: III  TM Distance: >3 FB Neck ROM: Full    Dental   Pulmonary Current Smoker   Pulmonary exam normal breath sounds clear to auscultation       Cardiovascular negative cardio ROS  Rhythm:Regular Rate:Normal     Neuro/Psych  PSYCHIATRIC DISORDERS Anxiety     negative neurological ROS     GI/Hepatic negative GI ROS, Neg liver ROS,,,  Endo/Other  negative endocrine ROS    Renal/GU negative Renal ROS     Musculoskeletal   Abdominal   Peds  Hematology  (+) Blood dyscrasia, anemia   Anesthesia Other Findings H/o VBAC x4  Takes baby aspirin  Reproductive/Obstetrics (+) Pregnancy                             Anesthesia Physical Anesthesia Plan  ASA: 2  Anesthesia Plan: Epidural   Post-op Pain Management:    Induction:   PONV Risk Score and Plan:   Airway Management Planned:   Additional Equipment:   Intra-op Plan:   Post-operative Plan:   Informed Consent: I have reviewed the patients History and Physical, chart, labs and discussed the procedure including the risks, benefits and alternatives for the proposed anesthesia with the patient or authorized representative who has indicated his/her understanding and acceptance.       Plan Discussed with: Anesthesiologist  Anesthesia Plan Comments: (I have discussed risks of neuraxial anesthesia including but not limited to infection, bleeding, nerve injury, back pain, headache, seizures, and failure of block. Patient denies bleeding disorders and is not currently anticoagulated. Labs have been reviewed. Risks and benefits discussed. All patient's  questions answered.  )       Anesthesia Quick Evaluation

## 2023-04-24 NOTE — Progress Notes (Signed)
Re-evaluated FHR strip. Has been much better. Excellent variabilty, spontaneous accels, no decels for the last ~1hr.  Restart pitocin at 2 units, will consider going up 1 X1 if needed due to FHR. Continue amnioinfusion  Sheppard Evens MD MPH OB Fellow, Faculty Practice [facility], Center for [facility] 04/24/2023

## 2023-04-25 ENCOUNTER — Encounter (HOSPITAL_COMMUNITY): Payer: Self-pay | Admitting: Student

## 2023-04-25 DIAGNOSIS — Z975 Presence of (intrauterine) contraceptive device: Secondary | ICD-10-CM

## 2023-04-25 DIAGNOSIS — O365930 Maternal care for other known or suspected poor fetal growth, third trimester, not applicable or unspecified: Secondary | ICD-10-CM

## 2023-04-25 DIAGNOSIS — O34219 Maternal care for unspecified type scar from previous cesarean delivery: Secondary | ICD-10-CM | POA: Insufficient documentation

## 2023-04-25 DIAGNOSIS — O09523 Supervision of elderly multigravida, third trimester: Secondary | ICD-10-CM

## 2023-04-25 DIAGNOSIS — Z30430 Encounter for insertion of intrauterine contraceptive device: Secondary | ICD-10-CM

## 2023-04-25 DIAGNOSIS — Z3A38 38 weeks gestation of pregnancy: Secondary | ICD-10-CM

## 2023-04-25 DIAGNOSIS — O34211 Maternal care for low transverse scar from previous cesarean delivery: Secondary | ICD-10-CM

## 2023-04-25 HISTORY — PX: IUD INSERTION: OBO1003

## 2023-04-25 LAB — RPR: RPR Ser Ql: NONREACTIVE

## 2023-04-25 MED ORDER — WITCH HAZEL-GLYCERIN EX PADS: 1.0000 | MEDICATED_PAD | CUTANEOUS | Status: DC | PRN

## 2023-04-25 MED ORDER — BENZOCAINE-MENTHOL 20-0.5 % EX AERO
1.0000 | INHALATION_SPRAY | CUTANEOUS | Status: DC | PRN
  Administered 2023-04-25: 1 via TOPICAL
  Filled 2023-04-25: qty 56

## 2023-04-25 MED ORDER — TETANUS-DIPHTH-ACELL PERTUSSIS 5-2.5-18.5 LF-MCG/0.5 IM SUSY: 0.5000 mL | PREFILLED_SYRINGE | Freq: Once | INTRAMUSCULAR | Status: DC

## 2023-04-25 MED ORDER — SENNOSIDES-DOCUSATE SODIUM 8.6-50 MG PO TABS
2.0000 | ORAL_TABLET | ORAL | Status: DC
  Administered 2023-04-25 – 2023-04-26 (×2): 2 via ORAL
  Filled 2023-04-25 (×2): qty 2

## 2023-04-25 MED ORDER — TRANEXAMIC ACID-NACL 1000-0.7 MG/100ML-% IV SOLN
1000.0000 mg | INTRAVENOUS | Status: AC
  Administered 2023-04-25: 1000 mg via INTRAVENOUS

## 2023-04-25 MED ORDER — ONDANSETRON HCL 4 MG PO TABS: 4.0000 mg | ORAL_TABLET | ORAL | Status: DC | PRN

## 2023-04-25 MED ORDER — PRENATAL MULTIVITAMIN CH
1.0000 | ORAL_TABLET | Freq: Every day | ORAL | Status: DC
  Administered 2023-04-25 – 2023-04-26 (×2): 1 via ORAL
  Filled 2023-04-25 (×2): qty 1

## 2023-04-25 MED ORDER — TRANEXAMIC ACID-NACL 1000-0.7 MG/100ML-% IV SOLN
INTRAVENOUS | Status: AC
  Filled 2023-04-25: qty 100

## 2023-04-25 MED ORDER — DIPHENHYDRAMINE HCL 25 MG PO CAPS: 25.0000 mg | ORAL_CAPSULE | Freq: Four times a day (QID) | ORAL | Status: DC | PRN

## 2023-04-25 MED ORDER — DIBUCAINE (PERIANAL) 1 % EX OINT: 1.0000 | TOPICAL_OINTMENT | CUTANEOUS | Status: DC | PRN

## 2023-04-25 MED ORDER — IBUPROFEN 600 MG PO TABS
600.0000 mg | ORAL_TABLET | Freq: Four times a day (QID) | ORAL | Status: DC
  Administered 2023-04-25 – 2023-04-26 (×5): 600 mg via ORAL
  Filled 2023-04-25 (×5): qty 1

## 2023-04-25 MED ORDER — ZOLPIDEM TARTRATE 5 MG PO TABS: 5.0000 mg | ORAL_TABLET | Freq: Every evening | ORAL | Status: DC | PRN

## 2023-04-25 MED ORDER — ONDANSETRON HCL 4 MG/2ML IJ SOLN: 4.0000 mg | INTRAMUSCULAR | Status: DC | PRN

## 2023-04-25 MED ORDER — SIMETHICONE 80 MG PO CHEW: 80.0000 mg | CHEWABLE_TABLET | ORAL | Status: DC | PRN

## 2023-04-25 MED ORDER — COCONUT OIL OIL: 1.0000 | TOPICAL_OIL | Status: DC | PRN

## 2023-04-25 MED ORDER — ACETAMINOPHEN 325 MG PO TABS: 650.0000 mg | ORAL_TABLET | ORAL | Status: DC | PRN

## 2023-04-25 NOTE — Lactation Note (Signed)
This note was copied from a baby's chart. Lactation Consultation Note  Patient Name: Laurie Friedman [id number] Date: 04/25/2023 Age:36 hours Reason for consult: Initial assessment;Early term 37-38.6wks  LC in to room for initial visit. Birthing parent is experienced, has breastfed for ~6 months 2 of her children. She explains she recently formula-fed infant. Provided hand pump per request and reinforced frequency/use/care and milk storage. LC did not demonstrate use. Parent plans to starts stimulating with manual pump and will breast/formula feed infant.  Reviewed normal newborn behavior during first 24h, expected output, tummy size and feeding frequency.  Provided a manual pump.  Plan:   1-Feeding on demand.  2-Hand pump use or when formula feeding, volume according to age, pace bottled feeding, frequent burping and upright position. 3-Encouraged birthing parent hydration, nutrition and rest.  Contact LC as needed for feeds/support/concerns/questions. All questions answered at this time. [facility] brochure reviewed.     Maternal Data Has patient been taught Hand Expression?: No Does the patient have breastfeeding experience prior to this delivery?: Yes How long did the patient breastfeed?: 70-months x 2 babies, the rest were formula-fed  Feeding Mother's Current Feeding Choice: Breast Milk and Formula  LATCH Score Latch: Too sleepy or reluctant, no latch achieved, no sucking elicited.  Audible Swallowing: None  Type of Nipple: Everted at rest and after stimulation  Comfort (Breast/Nipple): Soft / non-tender  Hold (Positioning): Full assist, staff holds infant at breast  LATCH Score: 4   Lactation Tools Discussed/Used Tools: Pump Breast pump type: Manual Pump Education: Setup, frequency, and cleaning;Milk Storage Reason for Pumping: Parent's request Pumping frequency: as needed  Interventions Interventions: Breast feeding basics reviewed;Skin to skin;Hand pump;Expressed  milk;Education;LC Services brochure  Discharge Pump: Personal;Hands Free;Manual WIC Program: Yes  Consult Status Consult Status: PRN Follow-up type: Call as needed    Laurie Friedman 04/25/2023, 10:36 AM

## 2023-04-25 NOTE — Discharge Summary (Signed)
Postpartum Discharge Summary  Date of Service updated-6/16     Patient Name: Laurie Friedman DOB: Apr 04, 1986 MRN: [id number]  Date of admission: 04/24/2023 Delivery date:04/25/2023  Delivering provider: Alfredia Ferguson  Date of discharge: 04/26/2023  Admitting diagnosis: Indication for care in labor or delivery [O75.9] Intrauterine pregnancy: [redacted]w[redacted]d     Secondary diagnosis:  Principal Problem:   VBAC, delivered, current hospitalization Active Problems:   Supervision of other normal pregnancy, antepartum   History of cesarean section   Advanced maternal age in multigravida   Fetal growth restriction antepartum   History of VBAC x 4   Indication for care in labor or delivery   IUD (intrauterine device) in place   Encounter for insertion of intrauterine contraceptive device (IUD)  Additional problems: none    Discharge diagnosis: VBAC                                              Post partum procedures: None Augmentation: AROM, Pitocin, and IP Foley Complications: None  Hospital course: Induction of Labor With Vaginal Delivery   37 y.o. yo [id number] at [redacted]w[redacted]d was admitted to the hospital 04/24/2023 for induction of labor.  Indication for induction:  fetal growth restriction .  Patient had an labor course complicated by: none Membrane Rupture Time/Date: 2:16 PM ,04/24/2023   Delivery Method:VBAC, Spontaneous  Episiotomy: None  Lacerations:  None  Details of delivery can be found in separate delivery note.  Patient had a postpartum course that was uncomplicated. patient is discharged home 04/26/23.  Newborn Data: Birth date:04/25/2023  Birth time:6:25 AM  Gender:Female  Living status:Living  Apgars:9 ,9  Weight:2807 g   Magnesium Sulfate received: No BMZ received: No Rhophylac:N/A MMR:N/A T-DaP:Given prenatally Flu: N/A Transfusion:No  Physical exam  Vitals:   04/25/23 1400 04/25/23 1800 04/25/23 2151 04/26/23 0551  BP: (!) 104/54 108/64 109/61 (!) 101/59  Pulse:  (!) 55 64 69 (!) 54  Resp: 18 18 17 18   Temp: 98.5 F (36.9 C) 98.2 F (36.8 C) 98.7 F (37.1 C) 98.4 F (36.9 C)  TempSrc: Oral Oral Oral Oral  SpO2: 100% 98% 99% 100%  Weight:      Height:       General: alert, cooperative, and no distress Lochia: appropriate Uterine Fundus: firm Incision: N/A DVT Evaluation: No evidence of DVT seen on physical exam. Labs: Lab Results  Component Value Date   WBC 9.5 04/24/2023   HGB 10.6 (L) 04/24/2023   HCT 32.2 (L) 04/24/2023   MCV 93.1 04/24/2023   PLT 199 04/24/2023      Latest Ref Rng & Units 12/15/2022    7:48 AM  CMP  Glucose 70 - 99 mg/dL 76   BUN 6 - 20 mg/dL 8   Creatinine 1.47 - 8.29 mg/dL 5.62   Sodium 130 - 865 mmol/L 137   Potassium 3.5 - 5.1 mmol/L 3.4   Chloride 98 - 111 mmol/L 103   CO2 22 - 32 mmol/L 23   Calcium 8.9 - 10.3 mg/dL 9.3   Total Protein 6.5 - 8.1 g/dL 6.2   Total Bilirubin 0.3 - 1.2 mg/dL 0.5   Alkaline Phos 38 - 126 U/L 51   AST 15 - 41 U/L 18   ALT 0 - 44 U/L 11    Edinburgh Score:    04/25/2023    9:50  AM  Edinburgh Postnatal Depression Scale Screening Tool  I have been able to laugh and see the funny side of things. 0  I have looked forward with enjoyment to things. 0  I have blamed myself unnecessarily when things went wrong. 0  I have been anxious or worried for no good reason. 0  I have felt scared or panicky for no good reason. 0  Things have been getting on top of me. 0  I have been so unhappy that I have had difficulty sleeping. 0  I have felt sad or miserable. 0  I have been so unhappy that I have been crying. 0  The thought of harming myself has occurred to me. 0  Edinburgh Postnatal Depression Scale Total 0     After visit meds:  Allergies as of 04/26/2023   No Known Allergies      Medication List     STOP taking these medications    aspirin EC 81 MG tablet       TAKE these medications    acetaminophen 325 MG tablet Commonly known as: Tylenol Take 2 tablets  (650 mg total) by mouth every 6 (six) hours as needed for mild pain (for pain scale < 4).   Blood Pressure Kit Devi 1 kit by Does not apply route once a week.   ibuprofen 600 MG tablet Commonly known as: ADVIL Take 1 tablet (600 mg total) by mouth every 6 (six) hours.   Vitafol Ultra 29-0.6-0.4-200 MG Caps Take 1 capsule by mouth daily.         Discharge home in stable condition Infant Feeding: Bottle and Breast Infant Disposition:home with mother Discharge instruction: per After Visit Summary and Postpartum booklet. Activity: Advance as tolerated. Pelvic rest for 6 weeks.  Diet: routine diet Future Appointments: Future Appointments  Date Time Provider Department Center  05/01/2023  9:55 AM Lennart Pall, MD [facility] None  05/08/2023 11:15 AM Magnus Sinning Dimas [place], PA-C [facility] None   Follow up Visit:  Follow-up Information     [facility] for [facility] Follow up.   Specialty: Obstetrics and Gynecology Why: Please follow up in 5-6wk for postpartum visit Contact information: [address] [place] [address] [phone]                The following message was sent to [employer] by Gwenyth Allegra, MD  Please schedule this patient for a In person postpartum visit in 6 weeks with the following provider: Any provider. Additional Postpartum F/U: non3   High risk pregnancy complicated by:  FGR Delivery mode:  VBAC, Spontaneous  Anticipated Birth Control:  PP IUD placed   04/26/2023 Sharon Seller, DO

## 2023-04-25 NOTE — Plan of Care (Signed)

## 2023-04-25 NOTE — Procedures (Signed)
  Post-Placental IUD Insertion Procedure Note  Patient identified, informed consent signed prior to delivery, signed copy in chart, time out was performed.    Vaginal, labial and perineal areas thoroughly inspected for lacerations. No laceration identified.  Mirena IUD grasped between sterile gloved fingers. Sterile lubrication applied to sterile gloved hand for ease of insertion. Fundus identified through abdominal wall using non-insertion hand. IUD inserted to fundus with bimanual technique. IUD carefully released at the fundus and insertion hand gently removed from vagina.   Strings trimmed to the level of the introitus. Patient tolerated procedure well.  Lot # [id number]  Expiration Date: 07/10/2025  Patient given post procedure instructions and IUD care card with expiration date.  Patient is asked to keep IUD strings tucked in her vagina until her postpartum follow up visit in 4-6 weeks. Patient advised to abstain from sexual intercourse and pulling on strings before her follow-up visit. Patient verbalized an understanding of the plan of care and agrees.    Sheppard Evens MD MPH OB Fellow, Faculty Practice [facility], Center for [facility] 04/25/2023

## 2023-04-26 MED ORDER — ACETAMINOPHEN 325 MG PO TABS: 650.0000 mg | ORAL_TABLET | Freq: Four times a day (QID) | ORAL | Status: AC | PRN

## 2023-04-26 MED ORDER — IBUPROFEN 600 MG PO TABS: 600.0000 mg | ORAL_TABLET | Freq: Four times a day (QID) | ORAL | 0 refills | Status: AC

## 2023-04-26 NOTE — Plan of Care (Signed)

## 2023-04-26 NOTE — Progress Notes (Signed)
CSW received consult for hx of Anxiety. CSW met with MOB to offer support and complete assessment. When CSW entered room, MOB was observed sitting in hospital bed eating. Infant was asleep on back in bassinet. FOB was present on couch nearby. MOB provided verbal consent to speak in front of FOB about anything. CSW introduced self and reason for consult. MOB presented as warm and remained engaged throughout consult.  CSW inquired how MOB is feeling emotionally since infant's arrival. MOB shared that she is feeling "normal." CSW inquired about MOB's mental health history. MOB reports she was diagnosed with anxiety in 2013 and confirmed endorsing panic attacks in the past. MOB denies mental health concerns during her pregnancy, sharing that she used to take clonazepam and attended therapy for management of her symptoms of anxiety but does not feel she needs to take medication or attend therapy anymore. MOB declined outpatient mental health resources at this time. CSW inquired if MOB endorsed symptoms of postpartum depression/anxiety after the birth of her other children. MOB recalls that she did endorse symptoms of postpartum anxiety after the birth of her last child who is now 11 years old. MOB attributed symptoms to her daughter's birth occurring at the same time as a relationship breakup and shares that this is the time that she realized how she was feeling was due to anxiety symptoms. MOB identified FOB and her sisters and brothers as supports. MOB denied current SI/HI.  CSW provided education regarding the baby blues period vs. perinatal mood disorders, discussed treatment and gave resources for mental health follow up if concerns arise.  CSW recommends self-evaluation during the postpartum time period using the New Mom Checklist from Postpartum Progress and encouraged MOB to contact a medical professional if symptoms are noted at any time.    MOB reports she has all needed items for infant, including a car  seat and bassinet. MOB has chosen [facility] for infant's follow up care. MOB states she receives WIC and is aware to notify her caseworker of infant's birth. MOB inquired about type of formula to purchase for infant. CSW communicated question to RN, RN to follow up with MOB prior to infant's discharge.  CSW provided review of Sudden Infant Death Syndrome (SIDS) precautions.    CSW identifies no further need for intervention and no barriers to discharge at this time.  Signed,  Jacky Hartung K. Mireya Meditz, MSW, LCSWA, LCASA 04/26/2023 2:17 PM 

## 2023-04-26 NOTE — Plan of Care (Signed)
Problem: Education: Goal: Knowledge of General Education information will improve Description: Including pain rating scale, medication(s)/side effects and non-pharmacologic comfort measures 04/26/2023 0929 by Donne Hazel, LPN Outcome: Adequate for Discharge 04/26/2023 0826 by Donne Hazel, LPN Outcome: Progressing   Problem: Health Behavior/Discharge Planning: Goal: Ability to manage health-related needs will improve 04/26/2023 0929 by Donne Hazel, LPN Outcome: Adequate for Discharge 04/26/2023 0826 by Donne Hazel, LPN Outcome: Progressing   Problem: Clinical Measurements: Goal: Ability to maintain clinical measurements within normal limits will improve 04/26/2023 0929 by Donne Hazel, LPN Outcome: Adequate for Discharge 04/26/2023 0826 by Donne Hazel, LPN Outcome: Progressing Goal: Will remain free from infection 04/26/2023 0929 by Donne Hazel, LPN Outcome: Adequate for Discharge 04/26/2023 0826 by Donne Hazel, LPN Outcome: Progressing Goal: Diagnostic test results will improve 04/26/2023 0929 by Donne Hazel, LPN Outcome: Adequate for Discharge 04/26/2023 0826 by Donne Hazel, LPN Outcome: Progressing Goal: Respiratory complications will improve 04/26/2023 0929 by Donne Hazel, LPN Outcome: Adequate for Discharge 04/26/2023 0826 by Donne Hazel, LPN Outcome: Progressing Goal: Cardiovascular complication will be avoided 04/26/2023 0929 by Donne Hazel, LPN Outcome: Adequate for Discharge 04/26/2023 0826 by Donne Hazel, LPN Outcome: Progressing   Problem: Activity: Goal: Risk for activity intolerance will decrease 04/26/2023 0929 by Donne Hazel, LPN Outcome: Adequate for Discharge 04/26/2023 0826 by Donne Hazel, LPN Outcome: Progressing   Problem: Nutrition: Goal: Adequate nutrition will be maintained 04/26/2023 0929 by Donne Hazel, LPN Outcome: Adequate for Discharge 04/26/2023 0826 by Donne Hazel, LPN Outcome: Progressing    Problem: Coping: Goal: Level of anxiety will decrease 04/26/2023 0929 by Donne Hazel, LPN Outcome: Adequate for Discharge 04/26/2023 0826 by Donne Hazel, LPN Outcome: Progressing   Problem: Elimination: Goal: Will not experience complications related to bowel motility 04/26/2023 0929 by Donne Hazel, LPN Outcome: Adequate for Discharge 04/26/2023 0826 by Donne Hazel, LPN Outcome: Progressing Goal: Will not experience complications related to urinary retention 04/26/2023 0929 by Donne Hazel, LPN Outcome: Adequate for Discharge 04/26/2023 0826 by Donne Hazel, LPN Outcome: Progressing   Problem: Pain Managment: Goal: General experience of comfort will improve 04/26/2023 0929 by Donne Hazel, LPN Outcome: Adequate for Discharge 04/26/2023 0826 by Donne Hazel, LPN Outcome: Progressing   Problem: Safety: Goal: Ability to remain free from injury will improve 04/26/2023 0929 by Donne Hazel, LPN Outcome: Adequate for Discharge 04/26/2023 0826 by Donne Hazel, LPN Outcome: Progressing   Problem: Skin Integrity: Goal: Risk for impaired skin integrity will decrease 04/26/2023 0929 by Donne Hazel, LPN Outcome: Adequate for Discharge 04/26/2023 0826 by Donne Hazel, LPN Outcome: Progressing   Problem: Education: Goal: Knowledge of condition will improve 04/26/2023 0929 by Donne Hazel, LPN Outcome: Adequate for Discharge 04/26/2023 0826 by Donne Hazel, LPN Outcome: Progressing Goal: Individualized Educational Video(s) 04/26/2023 0929 by Donne Hazel, LPN Outcome: Adequate for Discharge 04/26/2023 0826 by Donne Hazel, LPN Outcome: Progressing Goal: Individualized Newborn Educational Video(s) 04/26/2023 0929 by Donne Hazel, LPN Outcome: Adequate for Discharge 04/26/2023 0826 by Donne Hazel, LPN Outcome: Progressing   Problem: Activity: Goal: Will verbalize the importance of balancing activity with adequate rest periods 04/26/2023 0929 by Donne Hazel, LPN Outcome: Adequate for Discharge 04/26/2023 0826 by Donne Hazel, LPN Outcome: Progressing Goal: Ability to tolerate increased activity will improve 04/26/2023 0929 by Donne Hazel, LPN Outcome: Adequate for  Discharge 04/26/2023 0826 by Donne Hazel, LPN Outcome: Progressing   Problem: Coping: Goal: Ability to identify and utilize available resources and services will improve 04/26/2023 0929 by Donne Hazel, LPN Outcome: Adequate for Discharge 04/26/2023 0826 by Donne Hazel, LPN Outcome: Progressing   Problem: Life Cycle: Goal: Chance of risk for complications during the postpartum period will decrease 04/26/2023 0929 by Donne Hazel, LPN Outcome: Adequate for Discharge 04/26/2023 0826 by Donne Hazel, LPN Outcome: Progressing   Problem: Role Relationship: Goal: Ability to demonstrate positive interaction with newborn will improve 04/26/2023 0929 by Donne Hazel, LPN Outcome: Adequate for Discharge 04/26/2023 0826 by Donne Hazel, LPN Outcome: Progressing   Problem: Skin Integrity: Goal: Demonstration of wound healing without infection will improve 04/26/2023 0929 by Donne Hazel, LPN Outcome: Adequate for Discharge 04/26/2023 0826 by Donne Hazel, LPN Outcome: Progressing

## 2023-04-26 NOTE — Anesthesia Postprocedure Evaluation (Signed)
Anesthesia Post Note  Patient: Laurie Friedman  Procedure(s) Performed: AN AD HOC LABOR EPIDURAL     Patient location during evaluation: Mother Baby Anesthesia Type: Epidural Level of consciousness: awake, oriented and awake and alert Pain management: pain level controlled Vital Signs Assessment: post-procedure vital signs reviewed and stable Respiratory status: spontaneous breathing, respiratory function stable and nonlabored ventilation Cardiovascular status: stable Postop Assessment: no headache, adequate PO intake, able to ambulate, patient able to bend at knees and no apparent nausea or vomiting Anesthetic complications: no   No notable events documented.  Last Vitals:  Vitals:   04/25/23 2151 04/26/23 0551  BP: 109/61 (!) 101/59  Pulse: 69 (!) 54  Resp: 17 18  Temp: 37.1 C 36.9 C  SpO2: 99% 100%    Last Pain:  Vitals:   04/26/23 0551  TempSrc: Oral  PainSc:    Pain Goal:                   [name]

## 2023-04-28 ENCOUNTER — Inpatient Hospital Stay (HOSPITAL_COMMUNITY): Payer: Medicaid Other

## 2023-05-01 ENCOUNTER — Encounter: Payer: Medicaid Other | Admitting: Obstetrics and Gynecology

## 2023-05-03 ENCOUNTER — Encounter (HOSPITAL_COMMUNITY): Payer: Self-pay | Admitting: Obstetrics and Gynecology

## 2023-05-03 ENCOUNTER — Inpatient Hospital Stay (HOSPITAL_COMMUNITY)
Admission: AD | Admit: 2023-05-03 | Discharge: 2023-05-04 | DRG: 776 | Disposition: A | Discharge status: Home / Self Care | Payer: Medicaid Other | Attending: Obstetrics and Gynecology | Admitting: Obstetrics and Gynecology

## 2023-05-03 ENCOUNTER — Encounter: Payer: Self-pay | Admitting: Emergency Medicine

## 2023-05-03 ENCOUNTER — Ambulatory Visit
Admission: EM | Admit: 2023-05-03 | Discharge: 2023-05-03 | Disposition: A | Discharge status: Other Acute Inpatient Hospital | Payer: Medicaid Other

## 2023-05-03 ENCOUNTER — Other Ambulatory Visit: Payer: Self-pay

## 2023-05-03 DIAGNOSIS — O99335 Smoking (tobacco) complicating the puerperium: Secondary | ICD-10-CM | POA: Diagnosis present

## 2023-05-03 DIAGNOSIS — F17210 Nicotine dependence, cigarettes, uncomplicated: Secondary | ICD-10-CM | POA: Diagnosis present

## 2023-05-03 DIAGNOSIS — O1415 Severe pre-eclampsia, complicating the puerperium: Principal | ICD-10-CM | POA: Diagnosis present

## 2023-05-03 DIAGNOSIS — O1495 Unspecified pre-eclampsia, complicating the puerperium: Secondary | ICD-10-CM | POA: Diagnosis not present

## 2023-05-03 DIAGNOSIS — Z3A40 40 weeks gestation of pregnancy: Secondary | ICD-10-CM | POA: Diagnosis not present

## 2023-05-03 LAB — COMPREHENSIVE METABOLIC PANEL
ALT: 18 U/L (ref 0–44)
AST: 17 U/L (ref 15–41)
Albumin: 3.3 g/dL — ABNORMAL LOW (ref 3.5–5.0)
Alkaline Phosphatase: 118 U/L (ref 38–126)
Anion gap: 11 (ref 5–15)
BUN: 18 mg/dL (ref 6–20)
CO2: 21 mmol/L — ABNORMAL LOW (ref 22–32)
Calcium: 9.1 mg/dL (ref 8.9–10.3)
Chloride: 107 mmol/L (ref 98–111)
Creatinine, Ser: 0.8 mg/dL (ref 0.44–1.00)
GFR, Estimated: >60 mL/min (ref >60)
Glucose, Bld: 90 mg/dL (ref 70–99)
Potassium: 3.8 mmol/L (ref 3.5–5.1)
Sodium: 139 mmol/L (ref 135–145)
Total Bilirubin: 0.5 mg/dL (ref 0.3–1.2)
Total Protein: 6.7 g/dL (ref 6.5–8.1)

## 2023-05-03 LAB — CBC
HCT: 34.6 % — ABNORMAL LOW (ref 36.0–46.0)
Hemoglobin: 11.3 g/dL — ABNORMAL LOW (ref 12.0–15.0)
MCH: 30 pg (ref 26.0–34.0)
MCHC: 32.7 g/dL (ref 30.0–36.0)
MCV: 91.8 fL (ref 80.0–100.0)
Platelets: 265 10*3/uL (ref 150–400)
RBC: 3.77 MIL/uL — ABNORMAL LOW (ref 3.87–5.11)
RDW: 12.8 % (ref 11.5–15.5)
WBC: 8.1 10*3/uL (ref 4.0–10.5)
nRBC: 0 % (ref 0.0–0.2)

## 2023-05-03 MED ORDER — LABETALOL HCL 5 MG/ML IV SOLN: 80.0000 mg | INTRAVENOUS | Status: DC | PRN

## 2023-05-03 MED ORDER — NIFEDIPINE ER OSMOTIC RELEASE 30 MG PO TB24
30.0000 mg | ORAL_TABLET | ORAL | Status: DC
  Administered 2023-05-03: 30 mg via ORAL
  Filled 2023-05-03: qty 1

## 2023-05-03 MED ORDER — MAGNESIUM SULFATE BOLUS VIA INFUSION
4.0000 g | Freq: Once | INTRAVENOUS | Status: AC
  Administered 2023-05-03: 4 g via INTRAVENOUS
  Filled 2023-05-03: qty 1000

## 2023-05-03 MED ORDER — LABETALOL HCL 5 MG/ML IV SOLN: 20.0000 mg | INTRAVENOUS | Status: DC | PRN

## 2023-05-03 MED ORDER — HYDRALAZINE HCL 20 MG/ML IJ SOLN
5.0000 mg | INTRAMUSCULAR | Status: DC | PRN
  Administered 2023-05-03: 5 mg via INTRAVENOUS
  Filled 2023-05-03: qty 1

## 2023-05-03 MED ORDER — ASPIRIN-ACETAMINOPHEN-CAFFEINE 250-250-65 MG PO TABS: 2.0000 | ORAL_TABLET | Freq: Once | ORAL | Status: DC

## 2023-05-03 MED ORDER — HYDRALAZINE HCL 20 MG/ML IJ SOLN: 10.0000 mg | INTRAMUSCULAR | Status: DC | PRN

## 2023-05-03 MED ORDER — LACTATED RINGERS IV SOLN: INTRAVENOUS | Status: DC

## 2023-05-03 MED ORDER — HYDRALAZINE HCL 20 MG/ML IJ SOLN
10.0000 mg | INTRAMUSCULAR | Status: DC | PRN
  Administered 2023-05-03: 10 mg via INTRAVENOUS
  Filled 2023-05-03: qty 1

## 2023-05-03 MED ORDER — LABETALOL HCL 5 MG/ML IV SOLN: 40.0000 mg | INTRAVENOUS | Status: DC | PRN

## 2023-05-03 MED ORDER — MAGNESIUM SULFATE 40 GM/1000ML IV SOLN
2.0000 g/h | INTRAVENOUS | Status: DC
  Administered 2023-05-03: 2 g/h via INTRAVENOUS
  Filled 2023-05-03: qty 1000

## 2023-05-03 MED ORDER — PRENATAL MULTIVITAMIN CH: 1.0000 | ORAL_TABLET | Freq: Every day | ORAL | Status: DC

## 2023-05-03 MED ORDER — ACETAMINOPHEN 325 MG PO TABS
650.0000 mg | ORAL_TABLET | Freq: Four times a day (QID) | ORAL | Status: DC | PRN
  Administered 2023-05-04: 650 mg via ORAL
  Filled 2023-05-03: qty 2

## 2023-05-03 MED ORDER — IBUPROFEN 600 MG PO TABS
600.0000 mg | ORAL_TABLET | Freq: Four times a day (QID) | ORAL | Status: DC | PRN
  Administered 2023-05-03 – 2023-05-04 (×2): 600 mg via ORAL
  Filled 2023-05-03 (×2): qty 1

## 2023-05-03 MED ORDER — ACETAMINOPHEN-CAFFEINE 500-65 MG PO TABS
2.0000 | ORAL_TABLET | Freq: Once | ORAL | Status: AC
  Administered 2023-05-03: 2 via ORAL
  Filled 2023-05-03: qty 2

## 2023-05-03 MED ORDER — CYCLOBENZAPRINE HCL 5 MG PO TABS
10.0000 mg | ORAL_TABLET | Freq: Once | ORAL | Status: AC
  Administered 2023-05-03: 10 mg via ORAL
  Filled 2023-05-03: qty 2

## 2023-05-03 NOTE — ED Notes (Signed)
Patient is being discharged from the Urgent Care and sent to the Emergency Department via POV . Per AK, patient is in need of higher level of care due to htn/post partum/HA. Patient is aware and verbalizes understanding of plan of care.  Vitals:   05/03/23 1325  BP: (!) 152/81  Pulse: 74  Resp: 18  Temp: 97.8 F (36.6 C)  SpO2: 98%

## 2023-05-03 NOTE — ED Provider Notes (Signed)
Patient came to the urgent care due to worsening headache for the past 2 days, bilateral swelling of feet and is 8 days postpartum.  Blood pressure is 152/81.  She was advised to go to the ER for further evaluation.   Durward Parcel, FNP 05/03/23 1331

## 2023-05-03 NOTE — ED Triage Notes (Signed)
Pt here for HA in back of head x 2 days with some bilateral swelling of feet yesterday; pt is 8 days post partum vaginal delivery; pt denies issue with BP in past

## 2023-05-03 NOTE — MAU Provider Note (Signed)
History     [id number]  Arrival date and time: 05/03/23 1353    Chief Complaint  Patient presents with   Headache     HPI Laurie Friedman is a 37 y.o. [id number] at 8 days post partum who presents for headache.  Headache started 2 days ago. Reports throbbing pain throughout her whole head. Worse with lights, sounds, & lying down. Has been taking tylenol & ibuprofen without relief. Denies visual disturbance, or epigastric pain. Denies history of hypertension.   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   Fibroadenoma of breast    UTI (urinary tract infection)     Past Surgical History:  Procedure Laterality Date   BREAST LUMPECTOMY Left 2010   BREAST LUMPECTOMY Left 05/01/2022   Procedure: LEFT BREAST LUMPECTOMY;  Surgeon: Harriette Bouillon, MD;  Location: [facility];  Service: General;  Laterality: Left;   BREAST LUMPECTOMY Bilateral 2014   CESAREAN SECTION  2008   IUD INSERTION  04/25/2023    Family History  Problem Relation Age of Onset   Healthy Mother    Diabetes Maternal Grandmother    Breast cancer Neg Hx     Social History   Socioeconomic History   Marital status: Married    Spouse name: Not on file   Number of children: Not on file   Years of education: Not on file   Highest education level: Not on file  Occupational History   Not on file  Tobacco Use   Smoking status: Every Day    Packs/day: 1.00    Years: 8.00    Additional pack years: 0.00    Total pack years: 8.00    Types: Cigarettes    Last attempt to  quit: 09/2022    Years since quitting: 0.6   Smokeless tobacco: Never  Vaping Use   Vaping Use: Never used  Substance and Sexual Activity   Alcohol use: No   Drug use: No   Sexual activity: Yes    Partners: Male    Birth control/protection: I.U.D.    Comment: 2 weeks ago  Other Topics Concern   Not on file  Social History Narrative   Not on file   Social Determinants of Health   Financial Resource Strain: Not on file  Food Insecurity: No Food Insecurity (04/24/2023)   Hunger Vital Sign    Worried About Running Out of Food in the Last Year: Never true    Ran Out of Food in the Last Year: Never true  Transportation Needs: Unknown (04/24/2023)   PRAPARE - Administrator, Civil Service (Medical): No    Lack of Transportation (Non-Medical): Not on file  Physical Activity: Not on file  Stress: Not on file  Social Connections: Not on file  Intimate Partner Violence: Not At Risk (04/24/2023)   Humiliation, Afraid, Rape, and Kick questionnaire    Fear of Current or Ex-Partner: No    Emotionally Abused: No    Physically Abused: No    Sexually Abused: No    No Known Allergies  No current facility-administered medications on file prior to encounter.   Current Outpatient Medications on File Prior to Encounter  Medication Sig Dispense Refill   acetaminophen (TYLENOL) 325 MG tablet Take 2 tablets (650 mg total) by mouth every 6 (six) hours as needed for mild pain (for pain scale < 4).     Blood Pressure Monitoring (BLOOD PRESSURE KIT) DEVI 1 kit by Does not apply route once a week. 1 each 0   ibuprofen (ADVIL) 600 MG tablet Take 1 tablet (600 mg total) by mouth every 6 (six) hours. 30 tablet 0   Prenat-Fe Poly-Methfol-FA-DHA (VITAFOL ULTRA) 29-0.6-0.4-200 MG CAPS Take 1 capsule by mouth daily. 30 capsule 11     ROS Pertinent positives and negative per HPI, all others reviewed and negative  Physical Exam   BP (!) 141/74   Pulse 76   Temp 97.9 F (36.6 C) (Oral)    Resp 18   Ht 5\' 7"  (1.702 m)   Wt 64.1 kg   LMP 07/20/2022 (Approximate)   SpO2 97%   Breastfeeding Yes   BMI 22.13 kg/m   Patient Vitals for the past 24 hrs:  BP Temp Temp src Pulse Resp SpO2 Height Weight  05/03/23 1545 (!) 141/74 -- -- 76 -- -- -- --  05/03/23 1530 (!) 148/78 -- -- 70 -- -- -- --  05/03/23 1515 (!) 156/83 -- -- 67 -- -- -- --  05/03/23 1500 (!) 172/90 -- -- 63 -- -- -- --  05/03/23 1445 (!) 178/86 -- -- (!) 57 -- -- -- --  05/03/23 1435 (!) 168/90 -- -- 61 -- -- -- --  05/03/23 1412 (!) 154/92 -- -- 63 -- -- -- --  05/03/23 1405 (!) 173/86 97.9 F (36.6 C) Oral 63 18 97 % -- --  05/03/23 1400 -- -- -- -- -- -- 5\' 7"  (1.702 m) 64.1 kg    Physical Exam Vitals and nursing note reviewed.  Constitutional:      Appearance: She is well-developed.  Pulmonary:     Effort: Pulmonary effort is normal. No respiratory distress.     Breath sounds: Normal breath sounds. No wheezing.  Musculoskeletal:     Right lower leg: 1+ Pitting Edema present.     Left lower leg: 1+ Pitting Edema present.  Skin:    General: Skin is warm and dry.  Neurological:     Mental Status: She is alert.     Deep Tendon Reflexes:     Reflex Scores:      Patellar reflexes are 2+ on the right side and 2+ on the left side.    Comments: No clonus  Psychiatric:        Mood and Affect: Affect is tearful.       Labs Results for orders placed or performed during the hospital encounter of 05/03/23 (from the past 24 hour(s))  CBC     Status: Abnormal   Collection Time: 05/03/23  2:18 PM  Result Value Ref Range   WBC 8.1 4.0 - 10.5 K/uL   RBC 3.77 (L) 3.87 - 5.11 MIL/uL   Hemoglobin 11.3 (L) 12.0 - 15.0 g/dL   HCT 78.2 (L) 95.6 -  46.0 %   MCV 91.8 80.0 - 100.0 fL   MCH 30.0 26.0 - 34.0 pg   MCHC 32.7 30.0 - 36.0 g/dL   RDW 40.9 81.1 - 91.4 %   Platelets 265 150 - 400 K/uL   nRBC 0.0 0.0 - 0.2 %  Comprehensive metabolic panel     Status: Abnormal   Collection Time: 05/03/23  2:18 PM   Result Value Ref Range   Sodium 139 135 - 145 mmol/L   Potassium 3.8 3.5 - 5.1 mmol/L   Chloride 107 98 - 111 mmol/L   CO2 21 (L) 22 - 32 mmol/L   Glucose, Bld 90 70 - 99 mg/dL   BUN 18 6 - 20 mg/dL   Creatinine, Ser 7.82 0.44 - 1.00 mg/dL   Calcium 9.1 8.9 - 95.6 mg/dL   Total Protein 6.7 6.5 - 8.1 g/dL   Albumin 3.3 (L) 3.5 - 5.0 g/dL   AST 17 15 - 41 U/L   ALT 18 0 - 44 U/L   Alkaline Phosphatase 118 38 - 126 U/L   Total Bilirubin 0.5 0.3 - 1.2 mg/dL   GFR, Estimated >21 >30 mL/min   Anion gap 11 5 - 15    Imaging No results found.  MAU Course  Procedures Lab Orders         CBC         Comprehensive metabolic panel    Meds ordered this encounter  Medications   AND Linked Order Group    hydrALAZINE (APRESOLINE) injection 5 mg    hydrALAZINE (APRESOLINE) injection 10 mg    labetalol (NORMODYNE) injection 20 mg    labetalol (NORMODYNE) injection 40 mg   lactated ringers infusion   cyclobenzaprine (FLEXERIL) tablet 10 mg   DISCONTD: aspirin-acetaminophen-caffeine (EXCEDRIN MIGRAINE) per tablet 2 tablet   acetaminophen-caffeine (EXCEDRIN TENSION HEADACHE) 500-65 MG per tablet 2 tablet   Imaging Orders  No imaging studies ordered today    MDM High  Assessment and Plan   1. Hypertension in pregnancy, preeclampsia, severe, postpartum condition    -Postpartum patient presents with headache & severe range BPs. BPs treated with IV hydral x 2 doses. Headache improved with Excedrin migraine & flexeril. Preeclampsia labs normal.  -Severe preeclampsia based on new onset severe range BPs & headache.  -Patient to be admitted to [facility] for magnesium & procardia per Dr. Berton Lan.    Judeth Horn, NP 05/03/23 4:10 PM

## 2023-05-03 NOTE — MAU Note (Signed)
.  Laurie Friedman is a 37 y.o. at [redacted]w[redacted]d here in MAU reporting: she has a severe HA that started two days ago. She reports trying ibuprofen and tylenol around 0700 with no relief.   PIH Assessment: Headache present: Yes ;Has not responded to treatment Visual disturbances: None RUQ pain/Epigastric: None Atypical edema: None Hx of HBP: Patient denies BP Medications: None prescribed  Vaginal Bleeding, Discharge, and LOF:  Vag. Bleeding: None  Vaginal Discharge Amount: None   LMP: Patient's last menstrual period was 07/20/2022 (approximate).  Pain score:  Pain Score: 10-Worst pain ever Pain Location: Head  Vitals:   05/03/23 1405  BP: (!) 173/86  Pulse: 63  Resp: 18  Temp: 97.9 F (36.6 C)  SpO2: 97%     OB Office: Faculty Lab orders placed from triage: Urinalysis

## 2023-05-03 NOTE — H&P (Signed)
[facility]ACT[facility] ANTEPARTUM ADMISSION HISTORY AND PHYSICAL NOTE  History of Present Illness: Laurie Friedman is a 37 y.o. [phone] who is PPD8 s/p SVD being readmitted for postpartum preeclampsia with SF (BP)  Pt presented w/ 2 days of severe headache. Reports throbbing pain throughout her whole head. Worse with lights, sounds, & lying down. Has been taking tylenol & ibuprofen without relief.   Otherwise feeling ok. Denies visual changes, CP/SOB, RUQ/epigastric pain.  Was discharged on PPD1, normotensive at that time. No HTN hx.   In MAU, pt has had severe range BPs (150-170s/80-90s) and required hydral 5mg  & 10mg  to improve to 140-150s/70-80s. Serum labs reassuring w/ plt 265, Cr 0.8, ALT 18, AST 17. HA has improved from 10/10 to 4/10 with excedrin tension headache and flexeril.  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   Fibroadenoma of breast    UTI (urinary tract infection)     Past Surgical History:  Procedure Laterality Date   BREAST LUMPECTOMY Left 2010   BREAST LUMPECTOMY Left 05/01/2022   Procedure: LEFT BREAST LUMPECTOMY;  Surgeon: Harriette Bouillon, MD;  Location: [facility];  Service: General;  Laterality: Left;   BREAST LUMPECTOMY Bilateral 2014   CESAREAN SECTION  2008   IUD INSERTION  04/25/2023    OB History  Gravida Para Term Preterm AB Living  7 6 6   1 6   SAB IAB Ectopic Multiple Live Births  1     0 6    # Outcome Date GA Lbr Len/2nd Weight Sex Delivery Anes PTL Lv  7 Term 04/25/23 [redacted]w[redacted]d 11:52 / 00:17 2807 g M VBAC EPI  LIV  6 Term 04/13/12 [redacted]w[redacted]d  2722 g F  VBAC  N LIV  5 Term 03/18/11 [redacted]w[redacted]d  3175 g F VBAC  N LIV  4 SAB 2011          3 Term 09/11/09    M VBAC  N LIV  2 Term 11/26/07    M VBAC  N LIV  1 Term 11/21/06    Judie Petit CS-LTranv  N LIV    Obstetric Comments  Pt was pushing, fh was dropping- ended up with c/s  2013 baby was face up, had problems getting the shoulders out, was bleeding, got a shot    Social History   Socioeconomic History   Marital status: Married    Spouse name: Not on file   Number of children: Not on file   Years of education: Not on file   Highest education level: Not on file  Occupational History   Not on file  Tobacco Use   Smoking status: Every Day    Packs/day: 1.00    Years: 8.00    Additional pack years: 0.00    Total pack years: 8.00    Types: Cigarettes    Last attempt to quit: 09/2022    Years since quitting: 0.6   Smokeless tobacco: Never  Vaping Use   Vaping Use: Never used  Substance and Sexual Activity   Alcohol use: No   Drug use: No  Sexual activity: Yes    Partners: Male    Birth control/protection: I.U.D.    Comment: 2 weeks ago  Other Topics Concern   Not on file  Social History Narrative   Not on file   Social Determinants of Health   Financial Resource Strain: Not on file  Food Insecurity: No Food Insecurity (04/24/2023)   Hunger Vital Sign    Worried About Running Out of Food in the Last Year: Never true    Ran Out of Food in the Last Year: Never true  Transportation Needs: Unknown (04/24/2023)   PRAPARE - Administrator, Civil Service (Medical): No    Lack of Transportation (Non-Medical): Not on file  Physical Activity: Not on file  Stress: Not on file  Social Connections: Not on file    Family History  Problem Relation Age of Onset   Healthy Mother    Diabetes Maternal Grandmother    Breast cancer Neg Hx     No Known Allergies  Medications Prior to Admission  Medication Sig Dispense Refill Last Dose   acetaminophen (TYLENOL) 325 MG tablet Take  2 tablets (650 mg total) by mouth every 6 (six) hours as needed for mild pain (for pain scale < 4).   05/03/2023   Blood Pressure Monitoring (BLOOD PRESSURE KIT) DEVI 1 kit by Does not apply route once a week. 1 each 0    ibuprofen (ADVIL) 600 MG tablet Take 1 tablet (600 mg total) by mouth every 6 (six) hours. 30 tablet 0 05/03/2023   Prenat-Fe Poly-Methfol-FA-DHA (VITAFOL ULTRA) 29-0.6-0.4-200 MG CAPS Take 1 capsule by mouth daily. 30 capsule 11     Review of Systems -  per HPI  Vitals:  BP (!) 141/74   Pulse 76   Temp 97.9 F (36.6 C) (Oral)   Resp 18   Ht 5\' 7"  (1.702 m)   Wt 64.1 kg   LMP 07/20/2022 (Approximate)   SpO2 97%   Breastfeeding Yes   BMI 22.13 kg/m  Physical Examination: CONSTITUTIONAL: Well-developed, well-nourished female in no acute distress.  HENT:  Normocephalic NEUROLOGIC: Alert and oriented to person, place, and time. Grossly normal exam. CARDIOVASCULAR: Normal heart rate noted RESPIRATORY: Effort normal, no problems with respiration noted ABDOMEN: Soft, nontender, nondistended. MUSCULOSKELETAL: Minimal pedal edema  Labs:  Results for orders placed or performed during the hospital encounter of 05/03/23 (from the past 24 hour(s))  CBC   Collection Time: 05/03/23  2:18 PM  Result Value Ref Range   WBC 8.1 4.0 - 10.5 K/uL   RBC 3.77 (L) 3.87 - 5.11 MIL/uL   Hemoglobin 11.3 (L) 12.0 - 15.0 g/dL   HCT 67.8 (L) 93.8 - 10.1 %   MCV 91.8 80.0 - 100.0 fL   MCH 30.0 26.0 - 34.0 pg   MCHC 32.7 30.0 - 36.0 g/dL   RDW 75.1 02.5 - 85.2 %   Platelets 265 150 - 400 K/uL   nRBC 0.0 0.0 - 0.2 %  Comprehensive metabolic panel   Collection Time: 05/03/23  2:18 PM  Result Value Ref Range   Sodium 139 135 - 145 mmol/L   Potassium 3.8 3.5 - 5.1 mmol/L   Chloride 107 98 - 111 mmol/L   CO2 21 (L) 22 - 32 mmol/L   Glucose, Bld 90 70 - 99 mg/dL   BUN 18 6 - 20 mg/dL   Creatinine, Ser 7.78 0.44 - 1.00 mg/dL   Calcium 9.1 8.9 - 24.2 mg/dL   Total Protein 6.7  6.5 - 8.1  g/dL   Albumin 3.3 (L) 3.5 - 5.0 g/dL   AST 17 15 - 41 U/L   ALT 18 0 - 44 U/L   Alkaline Phosphatase 118 38 - 126 U/L   Total Bilirubin 0.5 0.3 - 1.2 mg/dL   GFR, Estimated >91 >47 mL/min   Anion gap 11 5 - 15   Assessment and Plan: Patient Active Problem List   Diagnosis Date Noted   Preeclampsia in postpartum period 05/03/2023   IUD (intrauterine device) in place 04/25/2023   History of VBAC x 4 03/10/2023   Fetal growth restriction antepartum 02/24/2023   History of cesarean section 12/10/2022   Supervision of other normal pregnancy, antepartum 11/06/2022   Admit to [facility] 4/2 x 24h for seizure ppx Procardia XL 30mg  daily Tylenol/ibuprofen prn for pain  Harvie Bridge, MD Obstetrician & Gynecologist, Faculty Practice Faculty Practice, [facility]

## 2023-05-04 DIAGNOSIS — Z3A40 40 weeks gestation of pregnancy: Secondary | ICD-10-CM

## 2023-05-04 DIAGNOSIS — O1495 Unspecified pre-eclampsia, complicating the puerperium: Secondary | ICD-10-CM

## 2023-05-04 MED ORDER — NIFEDIPINE ER 30 MG PO TB24: 30.0000 mg | ORAL_TABLET | ORAL | 0 refills | Status: DC

## 2023-05-04 NOTE — Discharge Summary (Signed)
Physician Discharge Summary  Patient ID: Laurie Friedman MRN: [id number] DOB/AGE: 1986/09/07 37 y.o.  Admit date: 05/03/2023 Discharge date: 05/04/2023  Admission Diagnoses:PPD8 s/p SVD being readmitted for postpartum preeclampsia with SF (BP)   Discharge Diagnoses:  Principal Problem:   Preeclampsia in postpartum period   Discharged Condition: good  Hospital Course: Laurie Friedman is a 37 y.o. [id number] who is PPD8 s/p SVD being readmitted for postpartum preeclampsia with SF (BP)   Pt presented w/ 2 days of severe headache. Reports throbbing pain throughout her whole head. Worse with lights, sounds, & lying down. Has been taking tylenol & ibuprofen without relief.    Otherwise feeling ok. Denies visual changes, CP/SOB, RUQ/epigastric pain.   Was discharged on PPD1, normotensive at that time. No HTN hx.    In [facility]U, pt has had severe range BPs (150-170s/80-90s) and required hydral 5mg  & 10mg  to improve to 140-150s/70-80s. Serum labs reassuring w/ plt 265, Cr 0.8, ALT 18, AST 17. HA has improved from 10/10 to 4/10 with excedrin tension headache and flexeril.  Her headache completely resolved and her blood pressure was normal after starting PO Procardia. She was stable for discharge and magnesium was discontinued after 16 hr.   Consults: None  Significant Diagnostic Studies: labs: CBC, CMP  Treatments: IV hydration and cardiac meds: labetolol and nifedipine  Discharge Exam: Blood pressure 110/62, pulse 78, temperature 98.5 F (36.9 C), temperature source Oral, resp. rate 17, height 5\' 7"  (1.702 m), weight 64.1 kg, SpO2 97 %, currently breastfeeding. General appearance: alert, cooperative, and no distress Resp: effort normal Extremities: extremities normal, atraumatic, no cyanosis or edema  Disposition: Discharge disposition: 01-Home or Self Care       Discharge Instructions     Discharge patient   Complete by: As directed    Discharge disposition: 01-Home or Self Care    Discharge patient date: 05/04/2023      Allergies as of 05/04/2023   No Known Allergies      Medication List     TAKE these medications    acetaminophen 325 MG tablet Commonly known as: Tylenol Take 2 tablets (650 mg total) by mouth every 6 (six) hours as needed for mild pain (for pain scale < 4).   Blood Pressure Kit Devi 1 kit by Does not apply route once a week.   ibuprofen 600 MG tablet Commonly known as: ADVIL Take 1 tablet (600 mg total) by mouth every 6 (six) hours.   NIFEdipine 30 MG 24 hr tablet Commonly known as: ADALAT CC Take 1 tablet (30 mg total) by mouth daily.   Vitafol Ultra 29-0.6-0.4-200 MG Caps Take 1 capsule by mouth daily.        Follow-up Information     [facility] Follow up in 3 week(s).   Specialty: Obstetrics and Gynecology Contact information: [address], [address] [place] [address] [phone]                Signed: Scheryl Darter 05/04/2023, 9:13 AM

## 2023-05-08 ENCOUNTER — Encounter: Payer: Medicaid Other | Admitting: Medical

## 2023-05-12 ENCOUNTER — Telehealth (HOSPITAL_COMMUNITY): Payer: Self-pay

## 2023-05-12 DIAGNOSIS — Z1331 Encounter for screening for depression: Secondary | ICD-10-CM

## 2023-05-12 NOTE — Telephone Encounter (Signed)
05/12/2023 1334  Name: Laurie Friedman MRN: [id number] DOB: Mar 23, 1986  Reason for Call:  Transition of Care Hospital Discharge Call  Contact Status: Patient Contact Status: Complete  Language assistant needed: Interpreter Mode: Interpreter Not Needed        Follow-Up Questions: Do You Have Any Concerns About Your Health As You Heal From Delivery?: No Do You Have Any Concerns About Your Infants Health?: No  Edinburgh Postnatal Depression Scale:  In the Past 7 Days: I have been able to laugh and see the funny side of things.: Not quite so much now I have looked forward with enjoyment to things.: Definitely less than I used to I have blamed myself unnecessarily when things went wrong.: No, never I have been anxious or worried for no good reason.: Yes, sometimes I have felt scared or panicky for no good reason.: Yes, sometimes Things have been getting on top of me.: Yes, sometimes I haven't been coping as well as usual I have been so unhappy that I have had difficulty sleeping.: Yes, sometimes I have felt sad or miserable.: Yes, quite often I have been so unhappy that I have been crying.: Yes, quite often The thought of harming myself has occurred to me.: Never Inocente Salles Postnatal Depression Scale Total: (!) 15 RN told patient about Maternal Mental Health Resources ([facility], National Maternal Mental Health Hotline, Postpartum Support International, National Suicide and Crisis Lifeline). Also told patient about [facility] support group offerings. Will e-mail these resources to patient as well.  PHQ2-9 Depression Scale:     Discharge Follow-up: Edinburgh score requires follow up?: Yes Provider notified of Edinburgh score?: Yes Have you already been referred for a counseling appointment?: No Patient was advised of the following resources:: Support Group, Breastfeeding Support Group Patient referred to::  (Integrated behavioral health referral placed) Did patient express  any COVID concerns?: No  Post-discharge interventions: Reviewed Newborn Safe Sleep Practices  Signature Signe Colt

## 2023-05-13 ENCOUNTER — Telehealth: Payer: Self-pay | Admitting: Clinical

## 2023-05-13 NOTE — Telephone Encounter (Signed)
Attempt call regarding referral; Pt declines BH appointment at this time but will consider and call back Laurie Friedman at [phone] for any questions, concerns and to schedule in the future as needed.

## 2023-05-31 ENCOUNTER — Other Ambulatory Visit: Payer: Self-pay | Admitting: Obstetrics & Gynecology

## 2023-06-01 ENCOUNTER — Telehealth: Payer: Self-pay | Admitting: *Deleted

## 2023-06-01 NOTE — Telephone Encounter (Signed)
Received fax refill request for Nifedipine 30mg  ER tablets.  Per chart review is patient of [facility]. Will route to that office. Nancy Fetter

## 2023-06-09 ENCOUNTER — Other Ambulatory Visit: Payer: Self-pay | Admitting: Obstetrics and Gynecology

## 2023-06-09 MED ORDER — NIFEDIPINE ER 30 MG PO TB24: 30.0000 mg | ORAL_TABLET | ORAL | 0 refills | Status: DC

## 2023-06-09 NOTE — Addendum Note (Signed)
Addended by: Harvie Bridge on: 06/09/2023 01:31 PM   Modules accepted: Orders

## 2023-06-11 ENCOUNTER — Encounter: Payer: Self-pay | Admitting: Obstetrics and Gynecology

## 2023-06-11 ENCOUNTER — Ambulatory Visit (INDEPENDENT_AMBULATORY_CARE_PROVIDER_SITE_OTHER): Payer: Medicaid Other | Admitting: Obstetrics and Gynecology

## 2023-06-11 DIAGNOSIS — T8332XA Displacement of intrauterine contraceptive device, initial encounter: Secondary | ICD-10-CM | POA: Diagnosis not present

## 2023-06-11 DIAGNOSIS — I10 Essential (primary) hypertension: Secondary | ICD-10-CM | POA: Diagnosis not present

## 2023-06-11 NOTE — Progress Notes (Signed)
Post Partum Visit Note  Laurie Friedman is a 37 y.o. [phone] female who presents for a postpartum visit. She is 6 weeks postpartum following a VBAC.  I have fully reviewed the prenatal and intrapartum course. The delivery was at 38.4 gestational weeks.  Anesthesia: epidural. Postpartum course has been Pt was readmitted for PP Preeclampsia. Baby is doing well. Baby is feeding by bottle - Similac Advance. Bleeding thin lochia. Bowel function is normal. Bladder function is normal. Patient is not sexually active. Contraception method is Mirena IUD. Postpartum depression screening: negative.   Upstream - 06/11/23 1326       Pregnancy Intention Screening   Does the patient want to become pregnant in the next year? No    Does the patient's partner want to become pregnant in the next year? No    Would the patient like to discuss contraceptive options today? No      Contraception Wrap Up   Current Method IUD or IUS            The pregnancy intention screening data noted above was reviewed. Potential methods of contraception were discussed. The patient elected to proceed with No data recorded.   Edinburgh Postnatal Depression Scale - 06/11/23 1325       Edinburgh Postnatal Depression Scale:  In the Past 7 Days   I have been able to laugh and see the funny side of things. 0    I have looked forward with enjoyment to things. 0    I have blamed myself unnecessarily when things went wrong. 0    I have been anxious or worried for no good reason. 3    I have felt scared or panicky for no good reason. 3    Things have been getting on top of me. 0    I have been so unhappy that I have had difficulty sleeping. 0    I have felt sad or miserable. 0    I have been so unhappy that I have been crying. 0    The thought of harming myself has occurred to me. 0    Edinburgh Postnatal Depression Scale Total 6             Health Maintenance Due  Topic Date Due   COVID-19 Vaccine (1) Never done    INFLUENZA VACCINE  06/11/2023    The following portions of the patient's history were reviewed and updated as appropriate: allergies, current medications, past family history, past medical history, past social history, past surgical history, and problem list.  Review of Systems Pertinent items are noted in HPI.  Objective:  BP (!) 158/102   Pulse 73   Ht 5\' 8"  (1.727 m)   Wt 140 lb (63.5 kg)   LMP 07/20/2022 (Approximate)   Breastfeeding No   BMI 21.29 kg/m    General:  alert, cooperative, and no distress   Breasts:  not indicated  Lungs: clear to auscultation bilaterally  Heart:  regular rate and rhythm  Abdomen: soft, non-tender; bowel sounds normal; no masses,  no organomegaly   Wound N/a  GU exam:   Normal ext genitalia, vagina and cervix; IUD strings were not seen       Assessment:    Encounter for postpartum care Normal postpartum exam.  IUD strings not visualized hypertension  Plan:   Essential components of care per ACOG recommendations:  1.  Mood and well being: Patient with negative depression screening today. Reviewed local resources for support.  -  Patient tobacco use? No.   - hx of drug use? No.    2. Infant care and feeding:  -Patient currently breastmilk feeding? No.  -Social determinants of health (SDOH) reviewed in EPIC. No concerns.  3. Sexuality, contraception and birth spacing - Patient does not want a pregnancy in the next year.  Desired family size is 6 children.  - Reviewed reproductive life planning. Reviewed contraceptive methods based on pt preferences and effectiveness.  Patient desired IUD or IUS today.   - Discussed birth spacing of 18 months  4. Sleep and fatigue -Encouraged family/partner/community support of 4 hrs of uninterrupted sleep to help with mood and fatigue  5. Physical Recovery  - Discussed patients delivery and complications. She describes her labor as good. - Patient had a Vaginal, no problems at delivery. Patient  had a  no  laceration. Perineal healing reviewed. Patient expressed understanding - Patient has urinary incontinence? No. - Patient is safe to resume physical and sexual activity  6.  Health Maintenance - HM due items addressed Yes - Last pap smear  Diagnosis  Date Value Ref Range Status  11/18/2022   Final   - Negative for intraepithelial lesion or malignancy (NILM)   Pap smear not done at today's visit.  -Breast Cancer screening indicated? No.   7. Chronic Disease/Pregnancy Condition follow up:  postpartum hypertension  Pt asked to resume procardia XL 30 mg, if BP still elevated refer pt to OB cards or internal medicine for continued surveillance  - PCP follow up  Warden Fillers, MD Center for [employer], [facility]

## 2023-06-25 ENCOUNTER — Ambulatory Visit (HOSPITAL_COMMUNITY)
Admission: RE | Admit: 2023-06-25 | Discharge: 2023-06-25 | Disposition: A | Discharge status: Home / Self Care | Payer: Medicaid Other | Source: Ambulatory Visit | Attending: Obstetrics and Gynecology | Admitting: Obstetrics and Gynecology

## 2023-06-25 DIAGNOSIS — T8332XA Displacement of intrauterine contraceptive device, initial encounter: Secondary | ICD-10-CM | POA: Insufficient documentation

## 2023-07-08 ENCOUNTER — Other Ambulatory Visit: Payer: Self-pay | Admitting: Obstetrics and Gynecology

## 2023-07-16 ENCOUNTER — Encounter: Payer: Self-pay | Admitting: Obstetrics and Gynecology

## 2023-07-16 ENCOUNTER — Ambulatory Visit (INDEPENDENT_AMBULATORY_CARE_PROVIDER_SITE_OTHER): Payer: Medicaid Other | Admitting: Obstetrics and Gynecology

## 2023-07-16 VITALS — BP 128/83 | HR 91 | Ht 68.0 in | Wt 144.1 lb

## 2023-07-16 DIAGNOSIS — N83201 Unspecified ovarian cyst, right side: Secondary | ICD-10-CM

## 2023-07-16 DIAGNOSIS — Z975 Presence of (intrauterine) contraceptive device: Secondary | ICD-10-CM | POA: Diagnosis not present

## 2023-07-16 NOTE — Progress Notes (Signed)
  CC: u/s follow up Subjective:    Patient ID: Laurie Friedman, female    DOB: 1986-10-23, 37 y.o.   MRN: [id number]  HPI Pt returns for u/s follow up regarding IUD without strings seen.  U/S confirms appropriate  placement.  Complex small right ovarian cyst noted. Radiology recommended follow up.  Pt agrees.  BP today normal on meds.   Review of Systems     Objective:   Physical Exam Vitals:   07/16/23 0901  BP: 128/83  Pulse: 91   CLINICAL DATA:  IUD string not seen.   EXAM: TRANSABDOMINAL AND TRANSVAGINAL ULTRASOUND OF PELVIS   TECHNIQUE: Both transabdominal and transvaginal ultrasound examinations of the pelvis were performed. Transabdominal technique was performed for global imaging of the pelvis including uterus, ovaries, adnexal regions, and pelvic cul-de-sac. It was necessary to proceed with endovaginal exam following the transabdominal exam to visualize the ovaries.   COMPARISON:  None Available.   FINDINGS: Uterus   Measurements: 10.2 x 5.5 x 8.4 cm = volume: 245.1 mL. No fibroids or other mass visualized. IUD is identified in the endometrial cavity.   Endometrium   Thickness: 4.4 mm.  No focal abnormality visualized.   Right ovary   Measurements: 3.7 x 3 x 2.9 cm = volume: 16.7 mL. A 2 x 2 x 2.3 cm complicated cystic mass is identified in the right ovary.   Left ovary   Measurements: 3.6 x 2.5 x 2 cm = volume: 9.4 mL. Normal appearance/no adnexal mass.   Other findings   Small amount of free fluid is identified.   IMPRESSION: 1. IUD is identified in the endometrial cavity. 2. 2.3 cm complicated cystic mass in the right ovary.This may represent a recently ruptured follicular cyst or hemorrhagic cyst. Recommend follow-up pelvic ultrasound in 6-8 weeks. 3. Small amount of free fluid in the pelvis, likely physiologic.          Assessment & Plan:   1. Cyst of right ovary Will schedule follow up u/s in 3 months for resolution  - US PELVIC  COMPLETE WITH TRANSVAGINAL; Future  2. IUD (intrauterine device) in place Correct placement noted, no potential issue until time of removal.  Pt advised to complete current prescription of procardia and then call for nurse visit BP check without meds.  I spent 10 minutes dedicated to the care of this patient including previsit review of records, face to face time with the patient discussing ultrasound results and post visit testing.   Warden Fillers, MD Faculty Attending, Center for [facility]

## 2023-07-16 NOTE — Progress Notes (Signed)
Pt is in the office to discuss u/s results from 06/25/23 concerning IUD placement.

## 2023-08-08 ENCOUNTER — Other Ambulatory Visit: Payer: Self-pay | Admitting: Family Medicine

## 2023-10-15 ENCOUNTER — Ambulatory Visit (HOSPITAL_COMMUNITY): Admission: RE | Admit: 2023-10-15 | Payer: Medicaid Other | Source: Ambulatory Visit

## 2024-03-21 ENCOUNTER — Emergency Department (HOSPITAL_COMMUNITY)

## 2024-03-21 ENCOUNTER — Encounter (HOSPITAL_COMMUNITY): Payer: Self-pay

## 2024-03-21 ENCOUNTER — Emergency Department (HOSPITAL_COMMUNITY)
Admission: EM | Admit: 2024-03-21 | Discharge: 2024-03-22 | Disposition: A | Discharge status: Home / Self Care | Attending: Emergency Medicine | Admitting: Emergency Medicine

## 2024-03-21 ENCOUNTER — Other Ambulatory Visit: Payer: Self-pay

## 2024-03-21 DIAGNOSIS — R079 Chest pain, unspecified: Secondary | ICD-10-CM | POA: Diagnosis present

## 2024-03-21 LAB — BASIC METABOLIC PANEL WITH GFR
Anion gap: 8 (ref 5–15)
BUN: 14 mg/dL (ref 6–20)
CO2: 24 mmol/L (ref 22–32)
Calcium: 9.6 mg/dL (ref 8.9–10.3)
Chloride: 107 mmol/L (ref 98–111)
Creatinine, Ser: 0.69 mg/dL (ref 0.44–1.00)
GFR, Estimated: >60 mL/min (ref >60)
Glucose, Bld: 97 mg/dL (ref 70–99)
Potassium: 3.4 mmol/L — ABNORMAL LOW (ref 3.5–5.1)
Sodium: 139 mmol/L (ref 135–145)

## 2024-03-21 LAB — CBC
HCT: 37.4 % (ref 36.0–46.0)
Hemoglobin: 12.3 g/dL (ref 12.0–15.0)
MCH: 30.8 pg (ref 26.0–34.0)
MCHC: 32.9 g/dL (ref 30.0–36.0)
MCV: 93.7 fL (ref 80.0–100.0)
Platelets: 243 10*3/uL (ref 150–400)
RBC: 3.99 MIL/uL (ref 3.87–5.11)
RDW: 12 % (ref 11.5–15.5)
WBC: 6.3 10*3/uL (ref 4.0–10.5)
nRBC: 0 % (ref 0.0–0.2)

## 2024-03-21 LAB — HCG, SERUM, QUALITATIVE: Preg, Serum: NEGATIVE

## 2024-03-21 LAB — TROPONIN I (HIGH SENSITIVITY): Troponin I (High Sensitivity): 6 ng/L (ref <18)

## 2024-03-21 NOTE — ED Triage Notes (Signed)
 Intermittent mid sternal chest pain that started a few days ago after starting an antibiotic. Pt states it is worse with certain movements.

## 2024-03-22 LAB — D-DIMER, QUANTITATIVE: D-Dimer, Quant: <0.27 ug{FEU}/mL (ref 0.00–0.50)

## 2024-03-22 LAB — TROPONIN I (HIGH SENSITIVITY): Troponin I (High Sensitivity): 5 ng/L (ref <18)

## 2024-03-22 MED ORDER — IBUPROFEN 200 MG PO TABS
600.0000 mg | ORAL_TABLET | Freq: Once | ORAL | Status: AC
  Administered 2024-03-22: 600 mg via ORAL
  Filled 2024-03-22: qty 3

## 2024-03-22 NOTE — Discharge Instructions (Addendum)
 It was a pleasure taking part in your care.  As discussed, your workup is reassuring.  You stated that your pain is worse with movement so I suspect that there might be a musculoskeletal component to your pain in the chest.  Please been taking ibuprofen  or Tylenol  for 6 hours as needed.  Please follow-up with your primary care doctor.  Return to the ED with any new or worsening symptoms.

## 2024-03-22 NOTE — ED Provider Notes (Signed)
 [place] EMERGENCY DEPARTMENT AT [facility] Provider Note   CSN: [id number] Arrival date & time: 03/21/24  1958     History  Chief Complaint  Patient presents with   Chest Pain    Laurie Friedman is a 38 y.o. female with history of anemia, anxiety, fibroadenoma of breast.  Patient went to ED for evaluation of chest pain.  States that she has had 1 week of chest pain which waxes and wanes in nature.  She is unsure of any aggravating or alleviating factors.  She reports that sometimes her positioning, her posture will worsen her chest pain.  She denies any shortness of breath, nausea, vomiting, abdominal pain, lightheadedness, dizziness, weakness.  She was actually seen by her PCP today for this chest pain and was advised that her PCP believes it was "maybe gas".  She denies any recent surgery or travel.  She denies history of DVT or PE.  She denies any unilateral leg swelling or hemoptysis.  She does take oral contraceptive pills.  Denies medications prior to arrival.  Denies any current chest pain.   Chest Pain Associated symptoms: no shortness of breath        Home Medications Prior to Admission medications   Medication Sig Start Date End Date Taking? Authorizing Provider  acetaminophen  (TYLENOL ) 325 MG tablet Take 2 tablets (650 mg total) by mouth every 6 (six) hours as needed for mild pain (for pain scale < 4). Patient not taking: Reported on 07/16/2023 04/26/23   Ozan, Jennifer, DO  Blood Pressure Monitoring (BLOOD PRESSURE KIT) DEVI 1 kit by Does not apply route once a week. Patient not taking: Reported on 07/16/2023 11/06/22   Abigail Abler, MD  ibuprofen  (ADVIL ) 600 MG tablet Take 1 tablet (600 mg total) by mouth every 6 (six) hours. Patient not taking: Reported on 07/16/2023 04/26/23   Ozan, Jennifer, DO  NIFEdipine  (ADALAT  CC) 30 MG 24 hr tablet TAKE 1 TABLET(30 MG) BY MOUTH DAILY 08/09/23   Granville Layer, MD  Prenat-Fe Poly-Methfol-FA-DHA (VITAFOL  ULTRA)  29-0.6-0.4-200 MG CAPS Take 1 capsule by mouth daily. 11/06/22   Abigail Abler, MD      Allergies    Patient has no known allergies.    Review of Systems   Review of Systems  Respiratory:  Negative for shortness of breath.   Cardiovascular:  Positive for chest pain.  All other systems reviewed and are negative.   Physical Exam Updated Vital Signs BP 98/61   Pulse 60   Temp 98.1 F (36.7 C) (Oral)   Resp 14   Ht 5\' 8"  (1.727 m)   Wt 68 kg   SpO2 98%   BMI 22.81 kg/m  Physical Exam Vitals and nursing note reviewed.  Constitutional:      General: She is not in acute distress.    Appearance: She is well-developed.  HENT:     Head: Normocephalic and atraumatic.     Nose: Nose normal.     Mouth/Throat:     Mouth: Mucous membranes are moist.  Eyes:     General:        Right eye: No discharge.        Left eye: No discharge.     Conjunctiva/sclera: Conjunctivae normal.  Cardiovascular:     Rate and Rhythm: Normal rate and regular rhythm.     Heart sounds: No murmur heard. Pulmonary:     Effort: Pulmonary effort is normal. No respiratory distress.  Breath sounds: Normal breath sounds.  Abdominal:     Palpations: Abdomen is soft.     Tenderness: There is no abdominal tenderness.  Musculoskeletal:        General: No swelling.     Cervical back: Neck supple.     Right lower leg: No edema.     Left lower leg: No edema.  Skin:    General: Skin is warm and dry.     Capillary Refill: Capillary refill takes less than 2 seconds.  Neurological:     Mental Status: She is alert.  Psychiatric:        Mood and Affect: Mood normal.     ED Results / Procedures / Treatments   Labs (all labs ordered are listed, but only abnormal results are displayed) Labs Reviewed  BASIC METABOLIC PANEL WITH GFR - Abnormal; Notable for the following components:      Result Value   Potassium 3.4 (*)    All other components within normal limits  CBC  HCG, SERUM, QUALITATIVE   D-DIMER, QUANTITATIVE  TROPONIN I (HIGH SENSITIVITY)  TROPONIN I (HIGH SENSITIVITY)    EKG None  Radiology DG Chest 2 View Result Date: 03/21/2024 CLINICAL DATA:  Intermittent substernal chest pain EXAM: CHEST - 2 VIEW COMPARISON:  06/25/2020 FINDINGS: The heart size and mediastinal contours are within normal limits. Both lungs are clear. The visualized skeletal structures are unremarkable. IMPRESSION: No active cardiopulmonary disease. Electronically Signed   By: Violeta Grey M.D.   On: 03/21/2024 20:40    Procedures Procedures    Medications Ordered in ED Medications - No data to display  ED Course/ Medical Decision Making/ A&P  Medical Decision Making Amount and/or Complexity of Data Reviewed Labs: ordered. Radiology: ordered.   38 year old female presents for evaluation.  Please see HPI for further details.  On examination the patient is afebrile and nontachycardic.  Her lung sounds are clear bilaterally, she is not hypoxic.  Abdomen soft and compressible.  Neurological examination at baseline.  No edema to bilateral lower extremities.  Overall nontoxic in appearance with reassuring vital signs.  Will work patient up for ACS versus PE.  Patient CBC without leukocytosis or anemia.  Metabolic panel with hypokalemia to 3.4, no other electrolyte derangement.  Patient D-dimer negative so doubt PE.  Troponin 6, delta 5.  Reassuring.  EKG is nonischemic.  Chest x-ray unremarkable.  At this time, patient workup reassuring.  Based on laboratory evaluation doubt ACS, doubt PE.  Patient reports that pain worse with movement so question possible musculoskeletal component to patient pain.  Have advised her to begin taking ibuprofen  or Tylenol  for 6 hours.  Have encouraged her to follow-up with her PCP.  She was given strict return precautions and she voiced understanding.  She is stable to discharge home.   Final Clinical Impression(s) / ED Diagnoses Final diagnoses:  Chest pain,  unspecified type    Rx / DC Orders ED Discharge Orders     None         Kristin Peyer 03/22/24 0322    Kelsey Patricia, MD 03/22/24 [phone]
